# Patient Record
Sex: Male | Born: 1985 | Race: Black or African American | Hispanic: No | Marital: Single | Smoking: Current some day smoker
Health system: Southern US, Community
[De-identification: ages and names within clinical notes are randomized; demographics above are authoritative.]

## PROBLEM LIST (undated history)

## (undated) ENCOUNTER — Emergency Department (HOSPITAL_COMMUNITY): Admission: EM | Payer: Self-pay | Source: Home / Self Care

## (undated) DIAGNOSIS — F329 Major depressive disorder, single episode, unspecified: Secondary | ICD-10-CM

## (undated) DIAGNOSIS — F32A Depression, unspecified: Secondary | ICD-10-CM

## (undated) DIAGNOSIS — Y249XXD Unspecified firearm discharge, undetermined intent, subsequent encounter: Secondary | ICD-10-CM

## (undated) DIAGNOSIS — F319 Bipolar disorder, unspecified: Secondary | ICD-10-CM

## (undated) DIAGNOSIS — W3400XD Accidental discharge from unspecified firearms or gun, subsequent encounter: Secondary | ICD-10-CM

## (undated) DIAGNOSIS — I10 Essential (primary) hypertension: Secondary | ICD-10-CM

## (undated) DIAGNOSIS — F209 Schizophrenia, unspecified: Secondary | ICD-10-CM

## (undated) HISTORY — DX: Accidental discharge from unspecified firearms or gun, subsequent encounter: W34.00XD

## (undated) HISTORY — DX: Unspecified firearm discharge, undetermined intent, subsequent encounter: Y24.9XXD

## (undated) HISTORY — PX: WISDOM TOOTH EXTRACTION: SHX21

---
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
## 2007-06-24 ENCOUNTER — Emergency Department (HOSPITAL_COMMUNITY): Admission: EM | Admit: 2007-06-24 | Discharge: 2007-06-24 | Payer: Self-pay | Admitting: Emergency Medicine

## 2009-01-18 ENCOUNTER — Emergency Department (HOSPITAL_COMMUNITY): Admission: EM | Admit: 2009-01-18 | Discharge: 2009-01-18 | Payer: Self-pay | Admitting: Emergency Medicine

## 2010-05-02 ENCOUNTER — Emergency Department (HOSPITAL_COMMUNITY): Admission: EM | Admit: 2010-05-02 | Discharge: 2010-05-02 | Payer: Self-pay | Admitting: Emergency Medicine

## 2011-04-01 LAB — DIFFERENTIAL
Basophils Relative: 0 % (ref 0–1)
Lymphocytes Relative: 17 % (ref 12–46)
Lymphs Abs: 1.3 10*3/uL (ref 0.7–4.0)
Monocytes Absolute: 0.5 10*3/uL (ref 0.1–1.0)
Monocytes Relative: 6 % (ref 3–12)
Neutro Abs: 5.8 10*3/uL (ref 1.7–7.7)
Neutrophils Relative %: 75 % (ref 43–77)

## 2011-04-01 LAB — COMPREHENSIVE METABOLIC PANEL
Albumin: 4.3 g/dL (ref 3.5–5.2)
Alkaline Phosphatase: 79 U/L (ref 39–117)
BUN: 11 mg/dL (ref 6–23)
Calcium: 9.4 mg/dL (ref 8.4–10.5)
Creatinine, Ser: 0.89 mg/dL (ref 0.4–1.5)
Glucose, Bld: 98 mg/dL (ref 70–99)
Potassium: 4.3 mEq/L (ref 3.5–5.1)
Total Protein: 7.5 g/dL (ref 6.0–8.3)

## 2011-04-01 LAB — CBC
HCT: 47.1 % (ref 39.0–52.0)
Hemoglobin: 15.9 g/dL (ref 13.0–17.0)
MCHC: 33.8 g/dL (ref 30.0–36.0)
Platelets: 199 10*3/uL (ref 150–400)
RDW: 13.9 % (ref 11.5–15.5)

## 2012-10-17 ENCOUNTER — Emergency Department (HOSPITAL_COMMUNITY): Payer: Self-pay

## 2012-10-17 ENCOUNTER — Encounter (HOSPITAL_COMMUNITY): Payer: Self-pay | Admitting: *Deleted

## 2012-10-17 ENCOUNTER — Emergency Department (HOSPITAL_COMMUNITY)
Admission: EM | Admit: 2012-10-17 | Discharge: 2012-10-17 | Disposition: A | Discharge status: Home / Self Care | Payer: Self-pay | Attending: Emergency Medicine | Admitting: Emergency Medicine

## 2012-10-17 DIAGNOSIS — Y9301 Activity, walking, marching and hiking: Secondary | ICD-10-CM | POA: Insufficient documentation

## 2012-10-17 DIAGNOSIS — S82839A Other fracture of upper and lower end of unspecified fibula, initial encounter for closed fracture: Secondary | ICD-10-CM

## 2012-10-17 DIAGNOSIS — Z8659 Personal history of other mental and behavioral disorders: Secondary | ICD-10-CM | POA: Insufficient documentation

## 2012-10-17 DIAGNOSIS — Y929 Unspecified place or not applicable: Secondary | ICD-10-CM | POA: Insufficient documentation

## 2012-10-17 DIAGNOSIS — W010XXA Fall on same level from slipping, tripping and stumbling without subsequent striking against object, initial encounter: Secondary | ICD-10-CM | POA: Insufficient documentation

## 2012-10-17 DIAGNOSIS — F17200 Nicotine dependence, unspecified, uncomplicated: Secondary | ICD-10-CM | POA: Insufficient documentation

## 2012-10-17 DIAGNOSIS — S82899A Other fracture of unspecified lower leg, initial encounter for closed fracture: Secondary | ICD-10-CM | POA: Insufficient documentation

## 2012-10-17 HISTORY — DX: Depression, unspecified: F32.A

## 2012-10-17 HISTORY — DX: Major depressive disorder, single episode, unspecified: F32.9

## 2012-10-17 HISTORY — DX: Bipolar disorder, unspecified: F31.9

## 2012-10-17 MED ORDER — OXYCODONE-ACETAMINOPHEN 5-325 MG PO TABS: 1.0000 | ORAL_TABLET | Freq: Four times a day (QID) | ORAL | Status: DC | PRN

## 2012-10-17 MED ORDER — OXYCODONE-ACETAMINOPHEN 5-325 MG PO TABS
2.0000 | ORAL_TABLET | Freq: Once | ORAL | Status: AC
  Administered 2012-10-17: 2 via ORAL
  Filled 2012-10-17: qty 2
  Filled 2012-10-17: qty 1

## 2012-10-17 NOTE — Progress Notes (Signed)
Orthopedic Tech Progress Note Patient Details:  Joseph Bautista July 28, 1986 [id number]  Patient ID: [name], male   DOB: 25-Jun-1986, 26 y.o.   MRN: [id number]   [name] 10/17/2012, 10:17 AM Pt has crutches

## 2012-10-17 NOTE — ED Provider Notes (Signed)
Medical screening examination/treatment/procedure(s) were performed by non-physician practitioner and as supervising physician I was immediately available for consultation/collaboration.   Richardean Canal, MD 10/17/12 1007

## 2012-10-17 NOTE — ED Notes (Addendum)
Pt tripped down stairs on Friday. Pt states that he was taking tyenol for the pain, but no relief. Pt states swelling and increased since this mornng. Pt states painful to walk and woke him up out of sleep. Pt CNS intact, pulses present and cap refill less than 3

## 2012-10-17 NOTE — ED Provider Notes (Signed)
History     CSN: [id number]  Arrival date & time 10/17/12  0700   First MD Initiated Contact with Patient 10/17/12 [phone]      Chief Complaint  Patient presents with  . Ankle Pain    (Consider location/radiation/quality/duration/timing/severity/associated sxs/prior treatment) HPI Comments: Patient reports that two days ago he tripped walking down the stairs.  He twisted his left ankle when he fell.  Yesterday he reports that the pain became worse.  He has been using his mother's crutches to ambulate.  He has been unable to bear weight.  Pain and swelling located over the left lateral malleolus.  No previous injury of the ankle.  He denies pain in his knees or hips.  No pain of the right ankle.  He has taken Tylenol for the pain, but does not feel that it is helping.    Patient is a 26 y.o. male presenting with ankle pain. The history is provided by the patient.  Ankle Pain  Pertinent negatives include no numbness.    Past Medical History  Diagnosis Date  . Depression   . Bipolar 1 disorder     History reviewed. No pertinent past surgical history.  History reviewed. No pertinent family history.  History  Substance Use Topics  . Smoking status: Current Every Day Smoker  . Smokeless tobacco: Not on file  . Alcohol Use: Yes      Review of Systems  Musculoskeletal:       Left ankle pain and swelling  Neurological: Negative for numbness.  All other systems reviewed and are negative.    Allergies  Tomato  Home Medications   Current Outpatient Rx  Name  Route  Sig  Dispense  Refill  . ACETAMINOPHEN 500 MG PO TABS   Oral   Take 500 mg by mouth every 6 (six) hours as needed. For pain           BP 140/69  Pulse 80  Temp 98 F (36.7 C) (Oral)  Resp 16  SpO2 98%  Physical Exam  Nursing note and vitals reviewed. Constitutional: He appears well-developed and well-nourished. No distress.  HENT:  Head: Normocephalic and atraumatic.  Mouth/Throat: Oropharynx is  clear and moist.  Cardiovascular: Normal rate, regular rhythm and normal heart sounds.   Pulses:      Dorsalis pedis pulses are 2+ on the right side, and 2+ on the left side.  Pulmonary/Chest: Effort normal and breath sounds normal.  Musculoskeletal:       Right hip: He exhibits normal range of motion and no tenderness.       Left hip: He exhibits normal range of motion, no tenderness and no bony tenderness.       Right knee: He exhibits normal range of motion. no tenderness found.       Left knee: He exhibits normal range of motion, no swelling, no effusion and no erythema. no tenderness found.       Right ankle: He exhibits normal range of motion. no tenderness.       Left ankle: He exhibits swelling. He exhibits normal range of motion, no deformity, no laceration and normal pulse. tenderness. Lateral malleolus tenderness found.  Neurological: He is alert. No sensory deficit.  Skin: Skin is warm and dry. He is not diaphoretic.  Psychiatric: He has a normal mood and affect.    ED Course  Procedures (including critical care time)  Labs Reviewed - No data to display Dg Tibia/fibula Left  10/17/2012  *RADIOLOGY  REPORT*  Clinical Data: History of trauma from a fall.  Ankle pain.  LEFT TIBIA AND FIBULA - 2 VIEW  Comparison: Left ankle radiograph on 10/17/2012.  Findings: AP lateral views of the left tibia and fibula again demonstrate an obliquely oriented nondisplaced, nonangulated fracture through the lateral malleolus.  The remainder of the fibula is otherwise intact.  Tibia is intact.  Soft tissue swelling lateral to the lateral malleolus.  IMPRESSION: 1.  The obliquely oriented fracture of the lateral malleolus redemonstrated.  No other acute fractures.   Original Report Authenticated By: Trudie Reed, M.D.    Dg Ankle Complete Left  10/17/2012  *RADIOLOGY REPORT*  Clinical Data: Ankle pain.  LEFT ANKLE COMPLETE - 3+ VIEW  Comparison: No priors.  Findings: Three views of the left ankle  demonstrate an oblique nondisplaced nonangulated fracture through the lateral malleolus with overlying soft tissue swelling.  Distal tibia is intact.  The ankle mortise appears intact.  IMPRESSION: 1.  Oblique nondisplaced nonangulated fracture through the lateral malleolus.   Original Report Authenticated By: Trudie Reed, M.D.    Dg Foot Complete Left  10/17/2012  *RADIOLOGY REPORT*  Clinical Data: Ankle pain.  LEFT FOOT - COMPLETE 3+ VIEW  Comparison: No priors.  Findings: Three views of the left foot demonstrate no acute fracture, subluxation or dislocation in the bones of the foot. There is an oblique nondisplaced fracture of the distal fibula.  IMPRESSION: 1.  No acute findings in the left foot. 2.  Oblique nondisplaced fracture of the distal fibula in the region of the lateral malleolus.   Original Report Authenticated By: Trudie Reed, M.D.      No diagnosis found.    MDM  Patient with an xray showing oblique nondisplaced fracture of the distal fibula.  Fracture is closed.  Patient is neurovascularly intact.  Patient given Crutches and Posterior Splint.  Patient also given prescription for Percocet and Orthopedic follow up.        Pascal Lux [place], PA-C 10/17/12 1002

## 2012-10-17 NOTE — Progress Notes (Signed)
Orthopedic Tech Progress Note Patient Details:  PALMER CARLS 07/31/1986 [id number]  Ortho Devices Type of Ortho Device: Post splint Splint Material: Fiberglass Ortho Device/Splint Interventions: Application   Cammer, Mickie Bail 10/17/2012, 10:16 AM

## 2013-01-25 ENCOUNTER — Emergency Department (HOSPITAL_COMMUNITY)
Admission: EM | Admit: 2013-01-25 | Discharge: 2013-01-25 | Disposition: A | Discharge status: Home / Self Care | Payer: Self-pay | Attending: Emergency Medicine | Admitting: Emergency Medicine

## 2013-01-25 ENCOUNTER — Encounter (HOSPITAL_COMMUNITY): Payer: Self-pay | Admitting: Emergency Medicine

## 2013-01-25 ENCOUNTER — Emergency Department (HOSPITAL_COMMUNITY): Payer: Self-pay

## 2013-01-25 DIAGNOSIS — Y939 Activity, unspecified: Secondary | ICD-10-CM | POA: Insufficient documentation

## 2013-01-25 DIAGNOSIS — S82892G Other fracture of left lower leg, subsequent encounter for closed fracture with delayed healing: Secondary | ICD-10-CM

## 2013-01-25 DIAGNOSIS — G8911 Acute pain due to trauma: Secondary | ICD-10-CM | POA: Insufficient documentation

## 2013-01-25 DIAGNOSIS — Z8659 Personal history of other mental and behavioral disorders: Secondary | ICD-10-CM | POA: Insufficient documentation

## 2013-01-25 DIAGNOSIS — Y929 Unspecified place or not applicable: Secondary | ICD-10-CM | POA: Insufficient documentation

## 2013-01-25 DIAGNOSIS — F17200 Nicotine dependence, unspecified, uncomplicated: Secondary | ICD-10-CM | POA: Insufficient documentation

## 2013-01-25 DIAGNOSIS — Y33XXXA Other specified events, undetermined intent, initial encounter: Secondary | ICD-10-CM | POA: Insufficient documentation

## 2013-01-25 DIAGNOSIS — I10 Essential (primary) hypertension: Secondary | ICD-10-CM | POA: Insufficient documentation

## 2013-01-25 DIAGNOSIS — S8290XS Unspecified fracture of unspecified lower leg, sequela: Secondary | ICD-10-CM | POA: Insufficient documentation

## 2013-01-25 DIAGNOSIS — S82899A Other fracture of unspecified lower leg, initial encounter for closed fracture: Secondary | ICD-10-CM | POA: Insufficient documentation

## 2013-01-25 DIAGNOSIS — Z8781 Personal history of (healed) traumatic fracture: Secondary | ICD-10-CM | POA: Insufficient documentation

## 2013-01-25 HISTORY — DX: Essential (primary) hypertension: I10

## 2013-01-25 MED ORDER — NAPROXEN 500 MG PO TABS: 500.0000 mg | ORAL_TABLET | Freq: Two times a day (BID) | ORAL | Status: DC
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
## 2013-01-25 NOTE — ED Notes (Signed)
Pt reports 2 week hx of pain in l/ankle. Swelling noted x 1 day. 11/13 Fracture of l/ankle- seen once by ortho

## 2013-01-27 NOTE — ED Provider Notes (Signed)
Medical screening examination/treatment/procedure(s) were performed by non-physician practitioner and as supervising physician I was immediately available for consultation/collaboration.  Azana Kiesler T Adiba Fargnoli, MD 01/27/13 0753 

## 2013-05-28 ENCOUNTER — Emergency Department (HOSPITAL_COMMUNITY)
Admission: EM | Admit: 2013-05-28 | Discharge: 2013-05-28 | Disposition: A | Discharge status: Home / Self Care | Payer: Self-pay | Attending: Emergency Medicine | Admitting: Emergency Medicine

## 2013-05-28 ENCOUNTER — Encounter (HOSPITAL_COMMUNITY): Payer: Self-pay | Admitting: Emergency Medicine

## 2013-05-28 DIAGNOSIS — I10 Essential (primary) hypertension: Secondary | ICD-10-CM | POA: Insufficient documentation

## 2013-05-28 DIAGNOSIS — S0181XA Laceration without foreign body of other part of head, initial encounter: Secondary | ICD-10-CM

## 2013-05-28 DIAGNOSIS — Z8659 Personal history of other mental and behavioral disorders: Secondary | ICD-10-CM | POA: Insufficient documentation

## 2013-05-28 DIAGNOSIS — F17200 Nicotine dependence, unspecified, uncomplicated: Secondary | ICD-10-CM | POA: Insufficient documentation

## 2013-05-28 DIAGNOSIS — S01501A Unspecified open wound of lip, initial encounter: Secondary | ICD-10-CM | POA: Insufficient documentation

## 2013-05-28 DIAGNOSIS — Z791 Long term (current) use of non-steroidal anti-inflammatories (NSAID): Secondary | ICD-10-CM | POA: Insufficient documentation
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
## 2013-10-19 ENCOUNTER — Emergency Department (HOSPITAL_COMMUNITY): Payer: Self-pay

## 2013-10-19 ENCOUNTER — Emergency Department (HOSPITAL_COMMUNITY)
Admission: EM | Admit: 2013-10-19 | Discharge: 2013-10-19 | Disposition: A | Discharge status: Home / Self Care | Payer: Self-pay | Attending: Emergency Medicine | Admitting: Emergency Medicine

## 2013-10-19 DIAGNOSIS — F17200 Nicotine dependence, unspecified, uncomplicated: Secondary | ICD-10-CM | POA: Insufficient documentation

## 2013-10-19 DIAGNOSIS — IMO0002 Reserved for concepts with insufficient information to code with codable children: Secondary | ICD-10-CM

## 2013-10-19 DIAGNOSIS — T07XXXA Unspecified multiple injuries, initial encounter: Secondary | ICD-10-CM

## 2013-10-19 DIAGNOSIS — Z8659 Personal history of other mental and behavioral disorders: Secondary | ICD-10-CM | POA: Insufficient documentation

## 2013-10-19 DIAGNOSIS — Z79899 Other long term (current) drug therapy: Secondary | ICD-10-CM | POA: Insufficient documentation

## 2013-10-19 DIAGNOSIS — S60219A Contusion of unspecified wrist, initial encounter: Secondary | ICD-10-CM | POA: Insufficient documentation

## 2013-10-19 DIAGNOSIS — S7010XA Contusion of unspecified thigh, initial encounter: Secondary | ICD-10-CM | POA: Insufficient documentation

## 2013-10-19 DIAGNOSIS — I10 Essential (primary) hypertension: Secondary | ICD-10-CM | POA: Insufficient documentation

## 2013-10-19 DIAGNOSIS — S8000XA Contusion of unspecified knee, initial encounter: Secondary | ICD-10-CM | POA: Insufficient documentation

## 2013-10-19 MED ORDER — OXYCODONE-ACETAMINOPHEN 5-325 MG PO TABS: ORAL_TABLET | ORAL | Status: DC

## 2013-10-19 MED ORDER — ONDANSETRON 4 MG PO TBDP
4.0000 mg | ORAL_TABLET | Freq: Once | ORAL | Status: AC
  Administered 2013-10-19: 4 mg via ORAL
  Filled 2013-10-19: qty 1

## 2013-10-19 MED ORDER — MORPHINE SULFATE 4 MG/ML IJ SOLN
6.0000 mg | Freq: Once | INTRAMUSCULAR | Status: AC
  Administered 2013-10-19: 6 mg via INTRAMUSCULAR
  Filled 2013-10-19: qty 2
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
## 2013-10-19 NOTE — ED Notes (Signed)
Ortho tech called for application of wrist splint and sling.

## 2013-10-23 ENCOUNTER — Emergency Department (HOSPITAL_COMMUNITY)
Admission: EM | Admit: 2013-10-23 | Discharge: 2013-10-23 | Disposition: A | Discharge status: Home / Self Care | Payer: Self-pay | Attending: Emergency Medicine | Admitting: Emergency Medicine

## 2013-10-23 ENCOUNTER — Encounter (HOSPITAL_COMMUNITY): Payer: Self-pay | Admitting: Emergency Medicine

## 2013-10-23 DIAGNOSIS — G8911 Acute pain due to trauma: Secondary | ICD-10-CM | POA: Insufficient documentation

## 2013-10-23 DIAGNOSIS — M25539 Pain in unspecified wrist: Secondary | ICD-10-CM | POA: Insufficient documentation

## 2013-10-23 DIAGNOSIS — T07XXXA Unspecified multiple injuries, initial encounter: Secondary | ICD-10-CM

## 2013-10-23 DIAGNOSIS — F17200 Nicotine dependence, unspecified, uncomplicated: Secondary | ICD-10-CM | POA: Insufficient documentation

## 2013-10-23 DIAGNOSIS — M79609 Pain in unspecified limb: Secondary | ICD-10-CM | POA: Insufficient documentation

## 2013-10-23 DIAGNOSIS — Z8659 Personal history of other mental and behavioral disorders: Secondary | ICD-10-CM | POA: Insufficient documentation

## 2013-10-23 DIAGNOSIS — I10 Essential (primary) hypertension: Secondary | ICD-10-CM | POA: Insufficient documentation

## 2013-10-23 MED ORDER — METHOCARBAMOL 750 MG PO TABS: 750.0000 mg | ORAL_TABLET | Freq: Four times a day (QID) | ORAL | Status: DC

## 2013-10-23 MED ORDER — OXYCODONE-ACETAMINOPHEN 5-325 MG PO TABS: 2.0000 | ORAL_TABLET | ORAL | Status: DC | PRN

## 2013-10-23 MED ORDER — IBUPROFEN 600 MG PO TABS: 600.0000 mg | ORAL_TABLET | Freq: Four times a day (QID) | ORAL | Status: DC | PRN
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
## 2013-10-24 NOTE — ED Provider Notes (Signed)
Medical screening examination/treatment/procedure(s) were performed by non-physician practitioner and as supervising physician I was immediately available for consultation/collaboration.  EKG Interpretation   None        Derwood Kaplan, MD 10/24/13 2305

## 2016-01-28 ENCOUNTER — Emergency Department (HOSPITAL_COMMUNITY)
Admission: EM | Admit: 2016-01-28 | Discharge: 2016-01-28 | Disposition: A | Discharge status: Home / Self Care | Payer: Self-pay | Attending: Emergency Medicine | Admitting: Emergency Medicine

## 2016-01-28 DIAGNOSIS — S0181XA Laceration without foreign body of other part of head, initial encounter: Secondary | ICD-10-CM | POA: Insufficient documentation

## 2016-01-28 DIAGNOSIS — S61012A Laceration without foreign body of left thumb without damage to nail, initial encounter: Secondary | ICD-10-CM | POA: Insufficient documentation

## 2016-01-28 DIAGNOSIS — Y9389 Activity, other specified: Secondary | ICD-10-CM | POA: Insufficient documentation

## 2016-01-28 DIAGNOSIS — IMO0002 Reserved for concepts with insufficient information to code with codable children: Secondary | ICD-10-CM

## 2016-01-28 DIAGNOSIS — Y998 Other external cause status: Secondary | ICD-10-CM | POA: Insufficient documentation

## 2016-01-28 DIAGNOSIS — I10 Essential (primary) hypertension: Secondary | ICD-10-CM | POA: Insufficient documentation

## 2016-01-28 DIAGNOSIS — Y9289 Other specified places as the place of occurrence of the external cause: Secondary | ICD-10-CM | POA: Insufficient documentation

## 2016-01-28 DIAGNOSIS — F419 Anxiety disorder, unspecified: Secondary | ICD-10-CM | POA: Insufficient documentation

## 2016-01-28 DIAGNOSIS — Z23 Encounter for immunization: Secondary | ICD-10-CM | POA: Insufficient documentation

## 2016-01-28 DIAGNOSIS — W260XXA Contact with knife, initial encounter: Secondary | ICD-10-CM | POA: Insufficient documentation

## 2016-01-28 DIAGNOSIS — Z87891 Personal history of nicotine dependence: Secondary | ICD-10-CM | POA: Insufficient documentation

## 2016-01-28 MED ORDER — TETANUS-DIPHTH-ACELL PERTUSSIS 5-2.5-18.5 LF-MCG/0.5 IM SUSP
0.5000 mL | Freq: Once | INTRAMUSCULAR | Status: AC
  Administered 2016-01-28: 0.5 mL via INTRAMUSCULAR
  Filled 2016-01-28: qty 0.5

## 2016-01-28 MED ORDER — LIDOCAINE HCL 2 % IJ SOLN
10.0000 mL | Freq: Once | INTRAMUSCULAR | Status: AC
  Administered 2016-01-28: 200 mg via INTRADERMAL
  Filled 2016-01-28: qty 20

## 2016-01-28 NOTE — Discharge Instructions (Signed)
Follow-up with your doctor in 3 days for a wound recheck. Continue taking Motrin and Tylenol for your discomfort. Return to ED for any new or worsening symptoms as we discussed.
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
## 2016-01-28 NOTE — ED Notes (Signed)
Declined W/C at D/C and was escorted to lobby by RN. 
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
## 2016-02-04 ENCOUNTER — Emergency Department (HOSPITAL_COMMUNITY)
Admission: EM | Admit: 2016-02-04 | Discharge: 2016-02-04 | Disposition: A | Discharge status: Home / Self Care | Payer: Self-pay | Attending: Emergency Medicine | Admitting: Emergency Medicine

## 2016-02-04 ENCOUNTER — Encounter (HOSPITAL_COMMUNITY): Payer: Self-pay

## 2016-02-04 DIAGNOSIS — I10 Essential (primary) hypertension: Secondary | ICD-10-CM | POA: Insufficient documentation

## 2016-02-04 DIAGNOSIS — Z87828 Personal history of other (healed) physical injury and trauma: Secondary | ICD-10-CM | POA: Insufficient documentation

## 2016-02-04 DIAGNOSIS — F17200 Nicotine dependence, unspecified, uncomplicated: Secondary | ICD-10-CM | POA: Insufficient documentation

## 2016-02-04 DIAGNOSIS — Z79899 Other long term (current) drug therapy: Secondary | ICD-10-CM | POA: Insufficient documentation

## 2016-02-04 DIAGNOSIS — Z79891 Long term (current) use of opiate analgesic: Secondary | ICD-10-CM | POA: Insufficient documentation

## 2016-02-04 DIAGNOSIS — Z8659 Personal history of other mental and behavioral disorders: Secondary | ICD-10-CM | POA: Insufficient documentation

## 2016-02-04 DIAGNOSIS — Z4802 Encounter for removal of sutures: Secondary | ICD-10-CM | POA: Insufficient documentation

## 2016-02-04 NOTE — ED Notes (Signed)
Declined W/C at D/C and was escorted to lobby by RN. 
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
## 2016-02-04 NOTE — Discharge Instructions (Signed)

## 2016-02-04 NOTE — ED Notes (Signed)
Here for suture removal from left thumb.  

## 2016-08-26 ENCOUNTER — Emergency Department (HOSPITAL_BASED_OUTPATIENT_CLINIC_OR_DEPARTMENT_OTHER): Payer: No Typology Code available for payment source

## 2016-08-26 ENCOUNTER — Encounter (HOSPITAL_BASED_OUTPATIENT_CLINIC_OR_DEPARTMENT_OTHER): Payer: Self-pay | Admitting: Emergency Medicine

## 2016-08-26 ENCOUNTER — Emergency Department (HOSPITAL_BASED_OUTPATIENT_CLINIC_OR_DEPARTMENT_OTHER)
Admission: EM | Admit: 2016-08-26 | Discharge: 2016-08-27 | Disposition: A | Discharge status: Home / Self Care | Payer: No Typology Code available for payment source | Attending: Emergency Medicine | Admitting: Emergency Medicine

## 2016-08-26 DIAGNOSIS — Y999 Unspecified external cause status: Secondary | ICD-10-CM | POA: Diagnosis not present

## 2016-08-26 DIAGNOSIS — T148XXA Other injury of unspecified body region, initial encounter: Secondary | ICD-10-CM

## 2016-08-26 DIAGNOSIS — I10 Essential (primary) hypertension: Secondary | ICD-10-CM | POA: Insufficient documentation

## 2016-08-26 DIAGNOSIS — Y92410 Unspecified street and highway as the place of occurrence of the external cause: Secondary | ICD-10-CM | POA: Insufficient documentation

## 2016-08-26 DIAGNOSIS — Y939 Activity, unspecified: Secondary | ICD-10-CM | POA: Diagnosis not present

## 2016-08-26 DIAGNOSIS — F17200 Nicotine dependence, unspecified, uncomplicated: Secondary | ICD-10-CM | POA: Diagnosis not present

## 2016-08-26 DIAGNOSIS — S3992XA Unspecified injury of lower back, initial encounter: Secondary | ICD-10-CM | POA: Diagnosis present

## 2016-08-26 DIAGNOSIS — S39012A Strain of muscle, fascia and tendon of lower back, initial encounter: Secondary | ICD-10-CM | POA: Diagnosis not present

## 2016-08-26 MED ORDER — CYCLOBENZAPRINE HCL 5 MG PO TABS: 5.0000 mg | ORAL_TABLET | Freq: Two times a day (BID) | ORAL | 0 refills | Status: AC | PRN

## 2016-08-26 MED ORDER — HYDROCODONE-ACETAMINOPHEN 7.5-325 MG/15ML PO SOLN
10.0000 mL | Freq: Once | ORAL | Status: AC
  Administered 2016-08-26: 10 mL via ORAL
  Filled 2016-08-26: qty 15

## 2016-08-26 MED ORDER — HYDROCODONE-ACETAMINOPHEN 5-325 MG PO TABS: 1.0000 | ORAL_TABLET | Freq: Three times a day (TID) | ORAL | 0 refills | Status: AC | PRN

## 2016-08-26 MED ORDER — CYCLOBENZAPRINE HCL 5 MG PO TABS
5.0000 mg | ORAL_TABLET | Freq: Once | ORAL | Status: AC
  Administered 2016-08-26: 5 mg via ORAL
  Filled 2016-08-26: qty 1

## 2016-08-26 MED ORDER — ACETAMINOPHEN 500 MG PO TABS: 1000.0000 mg | ORAL_TABLET | Freq: Three times a day (TID) | ORAL | 0 refills | Status: AC
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
## 2016-08-26 NOTE — ED Notes (Signed)
Patient transported to X-ray 

## 2016-08-26 NOTE — ED Notes (Signed)
MD at bedside. 

## 2016-08-27 NOTE — ED Notes (Signed)
Pt verbalizes understanding of d/c instructions and denies any further needs at this time. 

## 2016-09-05 ENCOUNTER — Encounter (HOSPITAL_COMMUNITY): Payer: Self-pay | Admitting: Emergency Medicine

## 2016-09-05 ENCOUNTER — Emergency Department (HOSPITAL_COMMUNITY): Payer: No Typology Code available for payment source

## 2016-09-05 ENCOUNTER — Emergency Department (HOSPITAL_COMMUNITY)
Admission: EM | Admit: 2016-09-05 | Discharge: 2016-09-05 | Disposition: A | Discharge status: Home / Self Care | Payer: No Typology Code available for payment source | Attending: Emergency Medicine | Admitting: Emergency Medicine

## 2016-09-05 DIAGNOSIS — I10 Essential (primary) hypertension: Secondary | ICD-10-CM | POA: Diagnosis not present

## 2016-09-05 DIAGNOSIS — M545 Low back pain, unspecified: Secondary | ICD-10-CM

## 2016-09-05 DIAGNOSIS — F17200 Nicotine dependence, unspecified, uncomplicated: Secondary | ICD-10-CM | POA: Insufficient documentation

## 2016-09-05 DIAGNOSIS — Y999 Unspecified external cause status: Secondary | ICD-10-CM | POA: Diagnosis not present

## 2016-09-05 DIAGNOSIS — Y939 Activity, unspecified: Secondary | ICD-10-CM | POA: Diagnosis not present

## 2016-09-05 DIAGNOSIS — Y92410 Unspecified street and highway as the place of occurrence of the external cause: Secondary | ICD-10-CM | POA: Insufficient documentation

## 2016-09-05 MED ORDER — KETOROLAC TROMETHAMINE 60 MG/2ML IM SOLN
60.0000 mg | Freq: Once | INTRAMUSCULAR | Status: AC
  Administered 2016-09-05: 60 mg via INTRAMUSCULAR
  Filled 2016-09-05: qty 2

## 2016-09-05 MED ORDER — TRAMADOL HCL 50 MG PO TABS: 50.0000 mg | ORAL_TABLET | Freq: Four times a day (QID) | ORAL | 0 refills | Status: DC | PRN

## 2016-09-05 MED ORDER — PREDNISONE 20 MG PO TABS
60.0000 mg | ORAL_TABLET | Freq: Once | ORAL | Status: AC
  Administered 2016-09-05: 60 mg via ORAL
  Filled 2016-09-05: qty 3

## 2016-09-05 MED ORDER — PREDNISONE 10 MG PO TABS: 20.0000 mg | ORAL_TABLET | Freq: Every day | ORAL | 0 refills | Status: DC
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
## 2016-10-07 ENCOUNTER — Ambulatory Visit (INDEPENDENT_AMBULATORY_CARE_PROVIDER_SITE_OTHER): Payer: Self-pay | Admitting: Orthopaedic Surgery

## 2017-03-11 ENCOUNTER — Encounter (HOSPITAL_COMMUNITY): Payer: Self-pay | Admitting: Emergency Medicine

## 2017-03-11 ENCOUNTER — Ambulatory Visit (HOSPITAL_COMMUNITY)
Admission: EM | Admit: 2017-03-11 | Discharge: 2017-03-11 | Disposition: A | Discharge status: Home / Self Care | Payer: Self-pay | Attending: Family Medicine | Admitting: Family Medicine

## 2017-03-11 DIAGNOSIS — H6500 Acute serous otitis media, unspecified ear: Secondary | ICD-10-CM

## 2017-03-11 MED ORDER — AMOXICILLIN 875 MG PO TABS: 875.0000 mg | ORAL_TABLET | Freq: Two times a day (BID) | ORAL | 0 refills | Status: DC

## 2017-03-11 MED ORDER — IPRATROPIUM BROMIDE 0.06 % NA SOLN: 2.0000 | Freq: Four times a day (QID) | NASAL | 0 refills | Status: DC

## 2017-03-11 NOTE — ED Provider Notes (Signed)
CSN: [id number]     Arrival date & time 03/11/17  1004 History   First MD Initiated Contact with Patient 03/11/17 1040     Chief Complaint  Patient presents with  . Otalgia   (Consider location/radiation/quality/duration/timing/severity/associated sxs/prior Treatment) Patient c/o left ear pain and uri sx's for a week   The history is provided by the patient.  Otalgia  Location:  Left Behind ear:  No abnormality Quality:  Aching Severity:  Moderate Onset quality:  Sudden Duration:  1 week Timing:  Intermittent Progression:  Worsening Chronicity:  New Relieved by:  Nothing Worsened by:  Nothing Ineffective treatments:  None tried Associated symptoms: hearing loss and rhinorrhea     Past Medical History:  Diagnosis Date  . Bipolar 1 disorder (HCC)   . Depression   . Hypertension    History reviewed. No pertinent surgical history. Family History  Problem Relation Age of Onset  . Diabetes Mother   . Hypertension Mother    Social History  Substance Use Topics  . Smoking status: Current Every Day Smoker  . Smokeless tobacco: Never Used  . Alcohol use Yes     Comment: occ    Review of Systems  Constitutional: Positive for fatigue.  HENT: Positive for ear pain, hearing loss and rhinorrhea.   Eyes: Negative.   Respiratory: Negative.   Cardiovascular: Negative.   Gastrointestinal: Negative.   Endocrine: Negative.   Genitourinary: Negative.   Musculoskeletal: Negative.   Allergic/Immunologic: Negative.   Neurological: Negative.   Hematological: Negative.   Psychiatric/Behavioral: Negative.     Allergies  Tomato  Home Medications   Prior to Admission medications   Medication Sig Start Date End Date Taking? Authorizing Provider  sertraline (ZOLOFT) 100 MG tablet Take 100 mg by mouth daily.   Yes Historical Provider, MD   Meds Ordered and Administered this Visit  Medications - No data to display  BP (!) 153/106 (BP Location: Left Arm)   Pulse 67   Temp  98.7 F (37.1 C) (Oral)   Resp 18   SpO2 99%  No data found.   Physical Exam  Constitutional: He is oriented to person, place, and time. He appears well-developed and well-nourished.  HENT:  Head: Normocephalic and atraumatic.  Right Ear: External ear normal.  Left Ear: External ear normal.  Mouth/Throat: Oropharynx is clear and moist.  Left TM erythematous and dull and decreased hearing acuity.  Eyes: Conjunctivae and EOM are normal. Pupils are equal, round, and reactive to light.  Neck: Normal range of motion. Neck supple.  Cardiovascular: Normal rate, regular rhythm and normal heart sounds.   Pulmonary/Chest: Effort normal and breath sounds normal.  Musculoskeletal: Normal range of motion.  Neurological: He is alert and oriented to person, place, and time.  Nursing note and vitals reviewed.   Urgent Care Course     Procedures (including critical care time)  Labs Review Labs Reviewed - No data to display  Imaging Review No results found.   Visual Acuity Review  Right Eye Distance:   Left Eye Distance:   Bilateral Distance:    Right Eye Near:   Left Eye Near:    Bilateral Near:         MDM   Otitis Media Left ear - Amoxicillin 875mg  one po bid x 7 days #14,  Atrovent Nasal Spray Push po fluids, rest, tylenol and motrin otc prn as directed for fever, arthralgias, and myalgias.  Follow up prn if sx's continue or persist.  Deatra CanterWilliam J Jair Lindblad, FNP 03/11/17 1109    Deatra CanterWilliam J Nelle Sayed, FNP 03/11/17 [phone]

## 2017-03-11 NOTE — ED Triage Notes (Signed)
[facility] a [facility] left sided ear pain x 1 week.

## 2017-07-16 ENCOUNTER — Emergency Department (HOSPITAL_BASED_OUTPATIENT_CLINIC_OR_DEPARTMENT_OTHER): Payer: Self-pay

## 2017-07-16 ENCOUNTER — Encounter (HOSPITAL_BASED_OUTPATIENT_CLINIC_OR_DEPARTMENT_OTHER): Payer: Self-pay

## 2017-07-16 ENCOUNTER — Emergency Department (HOSPITAL_BASED_OUTPATIENT_CLINIC_OR_DEPARTMENT_OTHER)
Admission: EM | Admit: 2017-07-16 | Discharge: 2017-07-16 | Disposition: A | Discharge status: Home / Self Care | Payer: Self-pay | Attending: Emergency Medicine | Admitting: Emergency Medicine

## 2017-07-16 DIAGNOSIS — R42 Dizziness and giddiness: Secondary | ICD-10-CM | POA: Insufficient documentation

## 2017-07-16 DIAGNOSIS — R7989 Other specified abnormal findings of blood chemistry: Secondary | ICD-10-CM | POA: Insufficient documentation

## 2017-07-16 DIAGNOSIS — F17210 Nicotine dependence, cigarettes, uncomplicated: Secondary | ICD-10-CM | POA: Insufficient documentation

## 2017-07-16 DIAGNOSIS — R945 Abnormal results of liver function studies: Secondary | ICD-10-CM

## 2017-07-16 DIAGNOSIS — I10 Essential (primary) hypertension: Secondary | ICD-10-CM | POA: Insufficient documentation

## 2017-07-16 LAB — COMPREHENSIVE METABOLIC PANEL
ALT: 89 U/L — AB (ref 17–63)
AST: 52 U/L — ABNORMAL HIGH (ref 15–41)
Albumin: 4.7 g/dL (ref 3.5–5.0)
Alkaline Phosphatase: 191 U/L — ABNORMAL HIGH (ref 38–126)
Anion gap: 9 (ref 5–15)
BILIRUBIN TOTAL: 0.8 mg/dL (ref 0.3–1.2)
BUN: 12 mg/dL (ref 6–20)
CALCIUM: 9.5 mg/dL (ref 8.9–10.3)
CHLORIDE: 101 mmol/L (ref 101–111)
CO2: 28 mmol/L (ref 22–32)
CREATININE: 1.07 mg/dL (ref 0.61–1.24)
GFR calc non Af Amer: >60 mL/min (ref >60)
Glucose, Bld: 110 mg/dL — ABNORMAL HIGH (ref 65–99)
Potassium: 3.9 mmol/L (ref 3.5–5.1)
Sodium: 138 mmol/L (ref 135–145)
TOTAL PROTEIN: 8.1 g/dL (ref 6.5–8.1)

## 2017-07-16 LAB — CBC
HEMATOCRIT: 47 % (ref 39.0–52.0)
Hemoglobin: 16.3 g/dL (ref 13.0–17.0)
MCH: 31.8 pg (ref 26.0–34.0)
MCHC: 34.7 g/dL (ref 30.0–36.0)
MCV: 91.8 fL (ref 78.0–100.0)
PLATELETS: 191 10*3/uL (ref 150–400)
RBC: 5.12 MIL/uL (ref 4.22–5.81)
RDW: 13.1 % (ref 11.5–15.5)
WBC: 7.1 10*3/uL (ref 4.0–10.5)

## 2017-07-16 LAB — CBG MONITORING, ED: Glucose-Capillary: 99 mg/dL (ref 65–99)

## 2017-07-16 NOTE — ED Notes (Signed)
Pt states he is in no pain now. When pain is present it radiates in right shoulder, lower back to front of abdomen. Pt stated that the pain moves all around and at times pain shoots to head and causes headache.

## 2017-07-16 NOTE — ED Notes (Signed)
Family at bedside. 

## 2017-07-16 NOTE — ED Triage Notes (Signed)
C/o dizziness, sweating, abd pain and right flank pain yesterday-NAD-steady gait

## 2017-07-16 NOTE — ED Notes (Signed)
Pt on monitor 

## 2017-07-16 NOTE — Discharge Instructions (Signed)
Is important that you stay well-hydrated with water. Follow-up with primary care for further evaluation of your symptoms, and to recheck your blood pressure. Return to the emergency room if you have persistent pain, any change in your symptoms, or any new or concerning symptoms.

## 2017-07-16 NOTE — ED Provider Notes (Signed)
[facility] Provider Note   CSN: [id number] Arrival date & time: 07/16/17  1150     History   Chief Complaint Chief Complaint  Patient presents with  . Dizziness    HPI Joseph Bautista is a 31 y.o. male with abdominal pain, right-sided chest pain, and sweating last night.  Patient states that he had acute onset of symptoms last night while at rest. He states the pain lasted for 10-15 minutes and then went away. Since then, he had a feeling of lightheadedness. He denies dizziness, vertigo, or feeling off-balance. He states he has never felt like this before. He denies pain currently or since last night, but states that he's had intermittent feelings of lightheadedness today. He has not had anything to eat or drink today, but states his appetite feels normal. He denies fevers, chills, sore throat, congestion, cough, chest pain, shortness of breath, nausea, vomiting, abdominal pain, numbness, or tingling. He denies urinary symptoms or abnormal bowel movements. He denies dizziness, vertigo, or feeling off balance. He denies other medical problems, and states he does not take medications on a daily basis. He reports increase in stress, as his good friend died last week, and the funeral was Tuesday. He reports increased alcohol consumption on Tuesday night, without intake.  HPI  Past Medical History:  Diagnosis Date  . Bipolar 1 disorder (HCC)   . Depression   . Hypertension     There are no active problems to display for this patient.   History reviewed. No pertinent surgical history.     Home Medications    Prior to Admission medications   Not on File    Family History Family History  Problem Relation Age of Onset  . Diabetes Mother   . Hypertension Mother     Social History Social History  Substance Use Topics  . Smoking status: Current Every Day Smoker    Types: Cigarettes  . Smokeless tobacco: Never Used  . Alcohol use Yes     Comment: weekly       Allergies   Tomato   Review of Systems Review of Systems  Constitutional: Negative for chills and fever.  HENT: Negative for congestion, sore throat and trouble swallowing.   Eyes: Negative for photophobia.  Respiratory: Negative for cough, chest tightness and shortness of breath.   Cardiovascular: Negative for chest pain.       Right-sided chest pain, resolved  Gastrointestinal: Negative for blood in stool, constipation, diarrhea, nausea and vomiting.       Abdominal pain, resolved  Genitourinary: Negative for dysuria, frequency and hematuria.  Musculoskeletal: Negative for neck pain and neck stiffness.  Skin: Negative for wound.  Allergic/Immunologic: Negative for immunocompromised state.  Neurological: Positive for light-headedness. Negative for dizziness.  Psychiatric/Behavioral: Negative for agitation and confusion.       Increased stress     Physical Exam Updated Vital Signs BP (!) 141/92   Pulse 62   Temp 97.9 F (36.6 C) (Oral)   Resp 12   Ht 6\' 2"  (1.88 m)   Wt 103.9 kg (229 lb)   SpO2 98%   BMI 29.40 kg/m   Physical Exam  Constitutional: He is oriented to person, place, and time. He appears well-developed and well-nourished.  HENT:  Head: Normocephalic and atraumatic.  Nose: Nose normal.  Mouth/Throat: Uvula is midline, oropharynx is clear and moist and mucous membranes are normal.  Eyes: Pupils are equal, round, and reactive to light. Conjunctivae and EOM are normal.  Neck: Normal range of motion. Neck supple.  Cardiovascular: Normal rate, regular rhythm, normal heart sounds and intact distal pulses.   Pulmonary/Chest: Effort normal and breath sounds normal. No respiratory distress. He has no wheezes.  Abdominal: Soft. Bowel sounds are normal. He exhibits no distension. There is no tenderness. There is no rebound and no guarding.  Musculoskeletal: Normal range of motion.  Lymphadenopathy:    He has no cervical adenopathy.  Neurological: He is  alert and oriented to person, place, and time. He has normal strength. No cranial nerve deficit or sensory deficit. He displays a negative Romberg sign. GCS eye subscore is 4. GCS verbal subscore is 5. GCS motor subscore is 6.  Skin: Skin is warm and dry.  Psychiatric: He has a normal mood and affect.  Nursing note and vitals reviewed.    ED Treatments / Results  Labs (all labs ordered are listed, but only abnormal results are displayed) Labs Reviewed  COMPREHENSIVE METABOLIC PANEL - Abnormal; Notable for the following:       Result Value   Glucose, Bld 110 (*)    AST 52 (*)    ALT 89 (*)    Alkaline Phosphatase 191 (*)    All other components within normal limits  CBC  CBG MONITORING, ED  CBG MONITORING, ED    EKG  EKG Interpretation  Date/Time:  Thursday July 16 2017 12:11:35 EDT Ventricular Rate:  55 PR Interval:    QRS Duration: 85 QT Interval:  390 QTC Calculation: 373 R Axis:   56 Text Interpretation:  Sinus rhythm No old tracing to compare Confirmed by Jerelyn ScottLinker, Martha [phone]([id number]) on 07/16/2017 12:37:45 PM       Radiology Dg Chest 2 View  Result Date: 07/16/2017 CLINICAL DATA:  Dizziness, pain inferior to the right arm and in the back last night. Persistent dizziness today. Current smoker. History of hypertension. EXAM: CHEST  2 VIEW COMPARISON:  Chest x-ray of January 18, 2009 FINDINGS: The lungs are well-expanded. The interstitial markings are coarse. There is no alveolar infiltrate or pleural effusion. The heart is top-normal in size. The pulmonary vascularity is normal. The trachea is midline. The bony thorax exhibits no acute abnormality. IMPRESSION: Mild chronic interstitial prominence likely reflecting the smoking history. There is no acute cardiopulmonary abnormality. Electronically Signed   By: David  SwazilandJordan M.D.   On: 07/16/2017 12:55    Procedures Procedures (including critical care time)  Medications Ordered in ED Medications - No data to  display   Initial Impression / Assessment and Plan / ED Course  I have reviewed the triage vital signs and the nursing notes.  Pertinent labs & imaging results that were available during my care of the patient were reviewed by me and considered in my medical decision making (see chart for details).     Patient with tenderness 15 minutes of symptoms last night, and was all spontaneously, and without current symptoms. Denies feeling of lightheadedness currently. Will order orthostatics, basic labs, EKG, and chest x-ray for further evaluation.  Orthostatics negative, EKG and chest x-ray negative. CMP showed mildly elevated liver enzymes, and CBC negative. As patient is currently without symptoms, physical exam was reassuring, and labs are reassuring, doubt cardiac or pulmonary cause for his symptoms. Doubt abdominal infection or pathologic process at this time. Discussed follow-up with PCP for evaluation of blood pressure and liver function. Per chart review, patient blood pressure has been elevated at every visit. Discussed case with attending, Dr. Karma GanjaLinker agrees to plan. Patient  appears safe for discharge at this time. Return precautions given. Patient states he understands and agrees to plan.   Final Clinical Impressions(s) / ED Diagnoses   Final diagnoses:  Light headedness  Elevated LFTs    New Prescriptions There are no discharge medications for this patient.    Alveria ApleyCaccavale, Alondria Mousseau, PA-C 07/16/17 1645    Phillis HaggisMabe, Martha L, MD 07/17/17 [phone]

## 2017-07-16 NOTE — ED Notes (Signed)
Patient transported to X-ray 

## 2017-10-12 ENCOUNTER — Encounter (HOSPITAL_BASED_OUTPATIENT_CLINIC_OR_DEPARTMENT_OTHER): Payer: Self-pay

## 2017-10-12 ENCOUNTER — Emergency Department (HOSPITAL_BASED_OUTPATIENT_CLINIC_OR_DEPARTMENT_OTHER)
Admission: EM | Admit: 2017-10-12 | Discharge: 2017-10-12 | Disposition: A | Discharge status: Home / Self Care | Payer: Self-pay | Attending: Emergency Medicine | Admitting: Emergency Medicine

## 2017-10-12 DIAGNOSIS — F319 Bipolar disorder, unspecified: Secondary | ICD-10-CM | POA: Insufficient documentation

## 2017-10-12 DIAGNOSIS — F17210 Nicotine dependence, cigarettes, uncomplicated: Secondary | ICD-10-CM | POA: Insufficient documentation

## 2017-10-12 DIAGNOSIS — I10 Essential (primary) hypertension: Secondary | ICD-10-CM | POA: Insufficient documentation

## 2017-10-12 DIAGNOSIS — M545 Low back pain: Secondary | ICD-10-CM | POA: Insufficient documentation

## 2017-10-12 DIAGNOSIS — F209 Schizophrenia, unspecified: Secondary | ICD-10-CM | POA: Insufficient documentation

## 2017-10-12 DIAGNOSIS — G8929 Other chronic pain: Secondary | ICD-10-CM | POA: Insufficient documentation

## 2017-10-12 DIAGNOSIS — Z79899 Other long term (current) drug therapy: Secondary | ICD-10-CM | POA: Insufficient documentation

## 2017-10-12 DIAGNOSIS — R52 Pain, unspecified: Secondary | ICD-10-CM

## 2017-10-12 DIAGNOSIS — R51 Headache: Secondary | ICD-10-CM | POA: Insufficient documentation

## 2017-10-12 DIAGNOSIS — M542 Cervicalgia: Secondary | ICD-10-CM | POA: Insufficient documentation

## 2017-10-12 DIAGNOSIS — R6889 Other general symptoms and signs: Secondary | ICD-10-CM

## 2017-10-12 HISTORY — DX: Schizophrenia, unspecified: F20.9

## 2017-10-12 LAB — CBG MONITORING, ED: Glucose-Capillary: 100 mg/dL — ABNORMAL HIGH (ref 65–99)

## 2017-10-12 MED ORDER — PROCHLORPERAZINE EDISYLATE 5 MG/ML IJ SOLN
10.0000 mg | Freq: Once | INTRAMUSCULAR | Status: AC
  Administered 2017-10-12: 10 mg via INTRAMUSCULAR
  Filled 2017-10-12: qty 2

## 2017-10-12 MED ORDER — DIPHENHYDRAMINE HCL 25 MG PO CAPS
25.0000 mg | ORAL_CAPSULE | Freq: Once | ORAL | Status: AC
  Administered 2017-10-12: 25 mg via ORAL
  Filled 2017-10-12: qty 1

## 2017-10-12 MED ORDER — KETOROLAC TROMETHAMINE 60 MG/2ML IM SOLN
60.0000 mg | Freq: Once | INTRAMUSCULAR | Status: AC
  Administered 2017-10-12: 60 mg via INTRAMUSCULAR
  Filled 2017-10-12: qty 2

## 2017-10-12 MED ORDER — DIPHENHYDRAMINE HCL 50 MG/ML IJ SOLN: 25.0000 mg | Freq: Once | INTRAMUSCULAR | Status: DC
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
## 2017-10-12 NOTE — ED Notes (Signed)
Pt educated about not driving or performing other critical tasks (such as operating heavy machinery, caring for infant/toddler/child) due to sedative nature of medications received in ED. Also warned about risks of consuming alcohol or taking other medications with sedative properties. Pt/caregiver verbalized understanding.  

## 2017-10-12 NOTE — ED Triage Notes (Signed)
C/o body aches and HA x 2 months-NAD-steady gait

## 2017-10-12 NOTE — Discharge Instructions (Signed)
Please call the number on your discharge paperwork to get established with a primary care provider.  Please have them recheck your liver enzymes since they were elevated during your emergency department visit in August.  You can take over-the-counter medications like Tylenol or ibuprofen as directed on the bottle if your symptoms persist.  If you develop new or worsening symptoms, including vomiting, the worst headache of your life, visual changes, chest pain, or other new concerning symptoms, please return to the emergency department for reevaluation.

## 2018-01-12 ENCOUNTER — Ambulatory Visit: Payer: Self-pay | Admitting: Family Medicine

## 2018-05-31 ENCOUNTER — Other Ambulatory Visit: Payer: Self-pay

## 2018-05-31 ENCOUNTER — Emergency Department (HOSPITAL_COMMUNITY)
Admission: EM | Admit: 2018-05-31 | Discharge: 2018-05-31 | Disposition: A | Discharge status: Home / Self Care | Payer: Self-pay | Attending: Emergency Medicine | Admitting: Emergency Medicine

## 2018-05-31 ENCOUNTER — Encounter (HOSPITAL_COMMUNITY): Payer: Self-pay | Admitting: *Deleted

## 2018-05-31 DIAGNOSIS — F17210 Nicotine dependence, cigarettes, uncomplicated: Secondary | ICD-10-CM | POA: Insufficient documentation

## 2018-05-31 DIAGNOSIS — I10 Essential (primary) hypertension: Secondary | ICD-10-CM | POA: Insufficient documentation

## 2018-05-31 LAB — URINALYSIS, ROUTINE W REFLEX MICROSCOPIC
Bilirubin Urine: NEGATIVE
GLUCOSE, UA: NEGATIVE mg/dL
HGB URINE DIPSTICK: NEGATIVE
Ketones, ur: NEGATIVE mg/dL
Leukocytes, UA: NEGATIVE
Nitrite: NEGATIVE
PROTEIN: NEGATIVE mg/dL
Specific Gravity, Urine: 1.015 (ref 1.005–1.030)
pH: 6 (ref 5.0–8.0)

## 2018-05-31 MED ORDER — AMLODIPINE BESYLATE 5 MG PO TABS: 5.0000 mg | ORAL_TABLET | Freq: Every day | ORAL | 0 refills | Status: DC

## 2018-05-31 NOTE — ED Notes (Signed)
Reports that for about 6 months when he sits up will get dizzy, get headaches, body aches. Pt reports that he has [facility] for his intermittent HTN and other complaints and told nothing wrong with him. Denies having a PCP to follow up with.

## 2018-05-31 NOTE — Care Management Note (Signed)
Case Management Note  CM consulted for no pcp and no ins with need for follow up and medication assistance.  [facility] for [facility] and financial counseling.  CM updated Lyla Sonarrie, Charity fundraiserN.  No further CM needs noted at this time.

## 2018-05-31 NOTE — ED Triage Notes (Signed)
Pt complains of back pain for the past 3 months. Pt complains of flank pain and headache for the past month.

## 2018-05-31 NOTE — ED Provider Notes (Signed)
[facility]-EMERGENCY DEPT Provider Note   CSN: [id number] Arrival date & time: 05/31/18  4098     History   Chief Complaint Chief Complaint  Patient presents with  . Back Pain  . Headache  . Flank Pain    HPI Joseph Bautista is a 32 y.o. male.  32 year old male presents that for the past 6 months he has had intermittent episodes of lower back pain with occasional radiation down his legs.  No bowel or bladder dysfunction.  Pain is sharp and positional.  No rashes.  No dark or bloody urine.  Also has had high blood pressure which she is not been treated for.  Denies any anginal or CHF symptoms.  No treatment used prior to arrival.  No severe headaches.     Past Medical History:  Diagnosis Date  . Bipolar 1 disorder (HCC)   . Depression   . Hypertension   . Schizophrenia (HCC)     There are no active problems to display for this patient.   History reviewed. No pertinent surgical history.      Home Medications    Prior to Admission medications   Medication Sig Start Date End Date Taking? Authorizing Provider  Sertraline HCl (ZOLOFT PO) Take by mouth.    [provider]  UNKNOWN TO PATIENT "bipolar med"    [provider]    Family History Family History  Problem Relation Age of Onset  . Diabetes Mother   . Hypertension Mother     Social History Social History   Tobacco Use  . Smoking status: Current Every Day Smoker    Types: Cigarettes  . Smokeless tobacco: Never Used  Substance Use Topics  . Alcohol use: Yes    Comment: weekly  . Drug use: Yes    Types: Marijuana     Allergies   Tomato   Review of Systems Review of Systems  All other systems reviewed and are negative.    Physical Exam Updated Vital Signs BP (!) 147/105 (BP Location: Right Arm)   Pulse 69   Temp 97.7 F (36.5 C) (Oral)   Resp 17   Ht 1.88 m (6\' 2" )   Wt 103 kg (227 lb)   SpO2 99%   BMI 29.15 kg/m   Physical Exam    Constitutional: He is oriented to person, place, and time. He appears well-developed and well-nourished.  Non-toxic appearance. No distress.  HENT:  Head: Normocephalic and atraumatic.  Eyes: Pupils are equal, round, and reactive to light. Conjunctivae, EOM and lids are normal.  Neck: Normal range of motion. Neck supple. No tracheal deviation present. No thyroid mass present.  Cardiovascular: Normal rate, regular rhythm and normal heart sounds. Exam reveals no gallop.  No murmur heard. Pulmonary/Chest: Effort normal and breath sounds normal. No stridor. No respiratory distress. He has no decreased breath sounds. He has no wheezes. He has no rhonchi. He has no rales.  Abdominal: Soft. Normal appearance and bowel sounds are normal. He exhibits no distension. There is no tenderness. There is no rebound and no CVA tenderness.  Musculoskeletal: Normal range of motion. He exhibits no edema or tenderness.       Back:  Neurological: He is alert and oriented to person, place, and time. He has normal strength. No cranial nerve deficit or sensory deficit. GCS eye subscore is 4. GCS verbal subscore is 5. GCS motor subscore is 6.  Skin: Skin is warm and dry. No abrasion and no rash noted.  Psychiatric: He has a normal mood and affect. His speech is normal and behavior is normal.  Nursing note and vitals reviewed.    ED Treatments / Results  Labs (all labs ordered are listed, but only abnormal results are displayed) Labs Reviewed  URINALYSIS, ROUTINE W REFLEX MICROSCOPIC    EKG None  Radiology No results found.  Procedures Procedures (including critical care time)  Medications Ordered in ED Medications - No data to display   Initial Impression / Assessment and Plan / ED Course  I have reviewed the triage vital signs and the nursing notes.  Pertinent labs & imaging results that were available during my care of the patient were reviewed by me and considered in my medical decision making  (see chart for details).    Patient likely muscular skeletal back pain.  Urinalysis is negative for protein or blood.  Blood pressure noted we will start patient on Norvasc and he will follow-up with a primary care doctor.  Final Clinical Impressions(s) / ED Diagnoses   Final diagnoses:  None    ED Discharge Orders    None       Lorre NickAllen, Kyser Wandel, MD 05/31/18 1000

## 2018-06-01 ENCOUNTER — Encounter (HOSPITAL_COMMUNITY): Payer: Self-pay | Admitting: Emergency Medicine

## 2018-06-01 ENCOUNTER — Emergency Department (HOSPITAL_COMMUNITY)
Admission: EM | Admit: 2018-06-01 | Discharge: 2018-06-02 | Disposition: A | Discharge status: Home / Self Care | Payer: Self-pay | Attending: Emergency Medicine | Admitting: Emergency Medicine

## 2018-06-01 ENCOUNTER — Emergency Department (HOSPITAL_COMMUNITY): Payer: Self-pay

## 2018-06-01 ENCOUNTER — Other Ambulatory Visit: Payer: Self-pay

## 2018-06-01 DIAGNOSIS — R079 Chest pain, unspecified: Secondary | ICD-10-CM | POA: Insufficient documentation

## 2018-06-01 DIAGNOSIS — F17210 Nicotine dependence, cigarettes, uncomplicated: Secondary | ICD-10-CM | POA: Insufficient documentation

## 2018-06-01 DIAGNOSIS — M791 Myalgia, unspecified site: Secondary | ICD-10-CM | POA: Insufficient documentation

## 2018-06-01 DIAGNOSIS — I10 Essential (primary) hypertension: Secondary | ICD-10-CM | POA: Insufficient documentation

## 2018-06-01 LAB — CBC
HCT: 44.8 % (ref 39.0–52.0)
Hemoglobin: 14.7 g/dL (ref 13.0–17.0)
MCH: 31.1 pg (ref 26.0–34.0)
MCHC: 32.8 g/dL (ref 30.0–36.0)
MCV: 94.7 fL (ref 78.0–100.0)
Platelets: 200 10*3/uL (ref 150–400)
RBC: 4.73 MIL/uL (ref 4.22–5.81)
RDW: 12.7 % (ref 11.5–15.5)
WBC: 8.7 10*3/uL (ref 4.0–10.5)

## 2018-06-01 LAB — BASIC METABOLIC PANEL
Anion gap: 7 (ref 5–15)
BUN: 18 mg/dL (ref 6–20)
CO2: 29 mmol/L (ref 22–32)
Calcium: 9.1 mg/dL (ref 8.9–10.3)
Chloride: 104 mmol/L (ref 101–111)
Creatinine, Ser: 1.12 mg/dL (ref 0.61–1.24)
GFR calc non Af Amer: >60 mL/min (ref >60)
Glucose, Bld: 118 mg/dL — ABNORMAL HIGH (ref 65–99)
Potassium: 4.2 mmol/L (ref 3.5–5.1)
Sodium: 140 mmol/L (ref 135–145)

## 2018-06-01 LAB — I-STAT TROPONIN, ED: TROPONIN I, POC: 0 ng/mL (ref 0.00–0.08)

## 2018-06-01 NOTE — ED Triage Notes (Signed)
Patient to ED for "sharp pains all over body" - initially stating the pain was intermittently in his chest for a few months, including now, but during triage, the pain went to his R underarm and he then went on to say it would go to his back, legs, and stomach. Patient states it feels like the same pain but it goes everywhere. [facility] and [facility] BP medication, which he started this morning. This afternoon, he reports an episode of lightheadedness and SOB that has since resolved. Skin warm/dry, resp e/u.

## 2018-06-02 MED ORDER — IBUPROFEN 600 MG PO TABS: 600.0000 mg | ORAL_TABLET | Freq: Four times a day (QID) | ORAL | 0 refills | Status: DC | PRN

## 2018-06-02 NOTE — ED Provider Notes (Signed)
[facility] EMERGENCY DEPARTMENT Provider Note   CSN: [id number] Arrival date & time: 06/01/18  2146     History   Chief Complaint Chief Complaint  Patient presents with  . Chest Pain    HPI Joseph Bautista is a 32 y.o. male.  Patient presents with concern for sharp pain in various locations including right side torso, chest, head. The pain moves around to these different locations and has been for "months" starting in his chest. He was seen yesterday with complaint of back pain and reports his pain was the same as tonight.   The history is provided by the patient. No language interpreter was used.  Chest Pain   Pertinent negatives include no cough, no fever, no nausea, no shortness of breath and no vomiting.    Past Medical History:  Diagnosis Date  . Bipolar 1 disorder (HCC)   . Depression   . Hypertension   . Schizophrenia (HCC)     There are no active problems to display for this patient.   History reviewed. No pertinent surgical history.      Home Medications    Prior to Admission medications   Medication Sig Start Date End Date Taking? Authorizing Provider  amLODipine (NORVASC) 5 MG tablet Take 1 tablet (5 mg total) by mouth daily. 05/31/18  Yes Lorre Nick, MD  Multiple Vitamin (MULTIVITAMIN WITH MINERALS) TABS tablet Take 1 tablet by mouth daily.   Yes [provider]    Family History Family History  Problem Relation Age of Onset  . Diabetes Mother   . Hypertension Mother     Social History Social History   Tobacco Use  . Smoking status: Current Every Day Smoker    Types: Cigarettes  . Smokeless tobacco: Never Used  Substance Use Topics  . Alcohol use: Yes    Comment: weekly  . Drug use: Yes    Types: Marijuana     Allergies   Tomato   Review of Systems Review of Systems  Constitutional: Negative for chills and fever.  HENT: Negative.   Respiratory: Negative.  Negative for cough and shortness  of breath.   Cardiovascular: Positive for chest pain (See HPI.).  Gastrointestinal: Negative.  Negative for nausea and vomiting.  Musculoskeletal: Positive for myalgias. Negative for arthralgias.  Skin: Negative.   Neurological: Negative.      Physical Exam Updated Vital Signs BP 131/78 (BP Location: Right Arm)   Pulse 76   Temp 98.3 F (36.8 C) (Oral)   Resp 18   Ht 6\' 2"  (1.88 m)   Wt 104.3 kg (230 lb)   SpO2 95%   BMI 29.53 kg/m   Physical Exam  Constitutional: He is oriented to person, place, and time. He appears well-developed and well-nourished.  Patient is very comfortable appearing, in NAD.  HENT:  Head: Normocephalic.  Neck: Normal range of motion. Neck supple.  Cardiovascular: Normal rate and regular rhythm.  No murmur heard. Pulmonary/Chest: Effort normal and breath sounds normal.  Abdominal: Soft. Bowel sounds are normal. There is no tenderness. There is no rebound and no guarding.  Musculoskeletal: Normal range of motion.  No tenderness that is reproducible to palpation. No swelling. FROM all extremities.  Neurological: He is alert and oriented to person, place, and time.  Skin: Skin is warm and dry. No rash noted.  Psychiatric: He has a normal mood and affect.  Nursing note and vitals reviewed.    ED Treatments / Results  Labs (all labs  ordered are listed, but only abnormal results are displayed) Labs Reviewed  BASIC METABOLIC PANEL - Abnormal; Notable for the following components:      Result Value   Glucose, Bld 118 (*)    All other components within normal limits  CBC  I-STAT TROPONIN, ED   Results for orders placed or performed during the hospital encounter of 06/01/18  Basic metabolic panel  Result Value Ref Range   Sodium 140 135 - 145 mmol/L   Potassium 4.2 3.5 - 5.1 mmol/L   Chloride 104 101 - 111 mmol/L   CO2 29 22 - 32 mmol/L   Glucose, Bld 118 (H) 65 - 99 mg/dL   BUN 18 6 - 20 mg/dL   Creatinine, Ser 7.821.12 0.61 - 1.24 mg/dL    Calcium 9.1 8.9 - 95.610.3 mg/dL   GFR calc non Af Amer >60 >60 mL/min   GFR calc Af Amer >60 >60 mL/min   Anion gap 7 5 - 15  CBC  Result Value Ref Range   WBC 8.7 4.0 - 10.5 K/uL   RBC 4.73 4.22 - 5.81 MIL/uL   Hemoglobin 14.7 13.0 - 17.0 g/dL   HCT 21.344.8 08.639.0 - 57.852.0 %   MCV 94.7 78.0 - 100.0 fL   MCH 31.1 26.0 - 34.0 pg   MCHC 32.8 30.0 - 36.0 g/dL   RDW 46.912.7 62.911.5 - 52.815.5 %   Platelets 200 150 - 400 K/uL  I-stat troponin, ED  Result Value Ref Range   Troponin i, poc 0.00 0.00 - 0.08 ng/mL   Comment 3           EKG EKG Interpretation  Date/Time:  Tuesday June 01 2018 21:51:39 EDT Ventricular Rate:  70 PR Interval:  158 QRS Duration: 78 QT Interval:  346 QTC Calculation: 373 R Axis:   37 Text Interpretation:  Normal sinus rhythm Cannot rule out Anterior infarct , age undetermined Abnormal ECG No significant change since last tracing Confirmed by Zadie RhineWickline, Donald ([id number]) on 06/02/2018 12:47:12 AM   Radiology Dg Chest 2 View  Result Date: 06/01/2018 CLINICAL DATA:  Intermittent chest pain for a few months. Patient reports lightheadedness and dyspnea which has since resolved today. EXAM: CHEST - 2 VIEW COMPARISON:  07/16/2017 FINDINGS: The heart size and mediastinal contours are within normal limits. Both lungs are clear. The visualized skeletal structures are unremarkable. IMPRESSION: No active cardiopulmonary disease. Electronically Signed   By: Tollie Ethavid  Kwon M.D.   On: 06/01/2018 22:56    Procedures Procedures (including critical care time)  Medications Ordered in ED Medications - No data to display   Initial Impression / Assessment and Plan / ED Course  I have reviewed the triage vital signs and the nursing notes.  Pertinent labs & imaging results that were available during my care of the patient were reviewed by me and considered in my medical decision making (see chart for details).     Patient returns to ED with diffuse, sharp, mobile pain. VSS. He does not appear ill  or uncomfortable.   Labs are normal. EKG, CXR without acute changes. He has been referred to primary care and this is reiterated on this visit. Recommended ibuprofen and patient reassured.   Final Clinical Impressions(s) / ED Diagnoses   Final diagnoses:  None   1. Myalgias  ED Discharge Orders    None       Elpidio AnisUpstill, Nelly Scriven, Cordelia Poche-C 06/02/18 0133    Zadie RhineWickline, Donald, MD 06/02/18 [phone]

## 2018-06-02 NOTE — Discharge Instructions (Addendum)
Call to get established with a primary care doctor for further evaluation of diffuse pain as well as regular management of blood pressure.

## 2018-06-09 ENCOUNTER — Encounter: Payer: Self-pay | Admitting: Family Medicine

## 2018-06-09 ENCOUNTER — Ambulatory Visit (INDEPENDENT_AMBULATORY_CARE_PROVIDER_SITE_OTHER): Payer: Self-pay | Admitting: Family Medicine

## 2018-06-09 VITALS — BP 128/84 | HR 70 | Temp 97.8°F | Ht 74.0 in | Wt 238.0 lb

## 2018-06-09 DIAGNOSIS — R109 Unspecified abdominal pain: Secondary | ICD-10-CM

## 2018-06-09 DIAGNOSIS — R51 Headache: Secondary | ICD-10-CM

## 2018-06-09 DIAGNOSIS — F3131 Bipolar disorder, current episode depressed, mild: Secondary | ICD-10-CM

## 2018-06-09 DIAGNOSIS — Z23 Encounter for immunization: Secondary | ICD-10-CM

## 2018-06-09 DIAGNOSIS — Z09 Encounter for follow-up examination after completed treatment for conditions other than malignant neoplasm: Secondary | ICD-10-CM

## 2018-06-09 DIAGNOSIS — R10A3 Flank pain, bilateral: Secondary | ICD-10-CM

## 2018-06-09 DIAGNOSIS — R519 Headache, unspecified: Secondary | ICD-10-CM

## 2018-06-09 DIAGNOSIS — Z131 Encounter for screening for diabetes mellitus: Secondary | ICD-10-CM

## 2018-06-09 DIAGNOSIS — Z0000 Encounter for general adult medical examination without abnormal findings: Secondary | ICD-10-CM

## 2018-06-09 LAB — POCT URINALYSIS DIP (MANUAL ENTRY)
Bilirubin, UA: NEGATIVE
Blood, UA: NEGATIVE
Glucose, UA: NEGATIVE mg/dL
Ketones, POC UA: NEGATIVE mg/dL
Leukocytes, UA: NEGATIVE
Nitrite, UA: NEGATIVE
Protein Ur, POC: NEGATIVE mg/dL
Spec Grav, UA: 1.025 (ref 1.010–1.025)
Urobilinogen, UA: 0.2 E.U./dL
pH, UA: 5.5 (ref 5.0–8.0)

## 2018-06-09 LAB — POCT GLYCOSYLATED HEMOGLOBIN (HGB A1C): Hemoglobin A1C: 5.4 % (ref 4.0–5.6)

## 2018-06-09 MED ORDER — TOPIRAMATE 25 MG PO TABS: 25.0000 mg | ORAL_TABLET | Freq: Two times a day (BID) | ORAL | 0 refills | Status: DC

## 2018-06-09 NOTE — Progress Notes (Signed)
Subjective:    Patient ID: Joseph Bautista, male    DOB: [birth year], 32 y.o.   MRN: [id number]   PCP: Joseph Ip, NP  Chief Complaint  Patient presents with  . Establish Care  . Hospitalization Follow-up    HPI  Mr. Joseph Bautista has a past medical history of Schizophrenia, Hypertension, Depression, and Bipolar Disorder. He is here today for hospital follow up and to establish care.   Current Status: He has c/o headaches, bilateral flank pain, and pain in legs for about 2 months. He states that he recently helped his mother move 2 months ago and he began to feel pain at that time. He is not taking anything for the pain.   He denies fevers, chills, fatigue, recent infections, weight loss, and night sweats.   He has not had any headaches, visual changes, dizziness, and falls.   No chest pain, heart palpitations, cough and shortness of breath reported.   No reports of GI problems such as nausea, vomiting, diarrhea, and constipation.   He has no reports of blood in stools, dysuria and hematuria.   No has history of depression and anxiety. He is diagnosed with Bi-polar disorder, which he is prescribed Zoloft. He states that he is currently not taking medication at this time because it decreases his Libido. He has a follow up appointment with his counselor at [facility] next week to discuss dose adjustments or other options.   He states that he uses Marijuana 2-3 times week and last used last night.   He is currently applying for '[employer]' card for medical assistance.   Past Medical History:  Diagnosis Date  . Bipolar 1 disorder (HCC)   . Depression   . Hypertension   . Schizophrenia (HCC)     Family History  Problem Relation Age of Onset  . Diabetes Mother   . Hypertension Mother    Social History   Socioeconomic History  . Marital status: Single    Spouse name: Not on file  . Number of children: Not on file  . Years of education: Not on file  . Highest education  level: Not on file  Occupational History  . Not on file  Social Needs  . Financial resource strain: Not on file  . Food insecurity:    Worry: Not on file    Inability: Not on file  . Transportation needs:    Medical: Not on file    Non-medical: Not on file  Tobacco Use  . Smoking status: Former Smoker    Types: Cigarettes  . Smokeless tobacco: Never Used  Substance and Sexual Activity  . Alcohol use: Yes    Comment: weekly  . Drug use: Yes    Types: Marijuana  . Sexual activity: Not on file  Lifestyle  . Physical activity:    Days per week: Not on file    Minutes per session: Not on file  . Stress: Not on file  Relationships  . Social connections:    Talks on phone: Not on file    Gets together: Not on file    Attends religious service: Not on file    Active member of club or organization: Not on file    Attends meetings of clubs or organizations: Not on file    Relationship status: Not on file  . Intimate partner violence:    Fear of current or ex partner: Not on file    Emotionally abused: Not on file    Physically abused:  Not on file    Forced sexual activity: Not on file  Other Topics Concern  . Not on file  Social History Narrative  . Not on file    History reviewed. No pertinent surgical history.   Immunization History  Administered Date(s) Administered  . Tdap 01/28/2016   Current Meds  Medication Sig  . amLODipine (NORVASC) 5 MG tablet Take 1 tablet (5 mg total) by mouth daily.  Marland Kitchen. ibuprofen (ADVIL,MOTRIN) 600 MG tablet Take 1 tablet (600 mg total) by mouth every 6 (six) hours as needed.  . Multiple Vitamin (MULTIVITAMIN WITH MINERALS) TABS tablet Take 1 tablet by mouth daily.    Allergies  Allergen Reactions  . Tomato Swelling and Other (See Comments)    Tongue burns    BP 128/84 (BP Location: Left Arm, Patient Position: Sitting, Cuff Size: Large)   Pulse 70   Temp 97.8 F (36.6 C) (Oral)   Ht 6\' 2"  (1.88 m)   Wt 238 lb (108 kg)   SpO2 98%    BMI 30.56 kg/m   Review of Systems  Constitutional: Negative.   HENT: Negative.   Eyes: Negative.   Respiratory: Negative.   Cardiovascular: Negative.   Gastrointestinal: Negative.   Endocrine: Negative.   Genitourinary: Positive for flank pain (right).  Musculoskeletal: Positive for back pain.  Skin: Negative.   Allergic/Immunologic: Negative.   Neurological: Negative.   Hematological: Negative.   Psychiatric/Behavioral: Negative.    Objective:   Physical Exam  Constitutional: He is oriented to person, place, and time. He appears well-developed and well-nourished.  HENT:  Head: Normocephalic and atraumatic.  Right Ear: External ear normal.  Left Ear: External ear normal.  Nose: Nose normal.  Mouth/Throat: Oropharynx is clear and moist.  Eyes: Pupils are equal, round, and reactive to light. Conjunctivae and EOM are normal.  Neck: Normal range of motion. Neck supple.  Cardiovascular: Normal rate, regular rhythm, normal heart sounds and intact distal pulses.  Pulmonary/Chest: Effort normal and breath sounds normal.  Abdominal: Soft. Bowel sounds are normal.  Musculoskeletal: Normal range of motion.  Neurological: He is alert and oriented to person, place, and time.  Skin: Skin is warm and dry. Capillary refill takes less than 2 seconds.  Psychiatric: He has a normal mood and affect. His behavior is normal. Judgment and thought content normal.  Nursing note and vitals reviewed.  Assessment & Plan:   1. Screening for diabetes mellitus Hgb A1c is normal at 5.4 today. Urinalysis is negative.  - POCT glycosylated hemoglobin (Hb A1C) - POCT urinalysis dipstick  2. Health care maintenance - Tdap vaccine greater than or equal to 7yo IM  3. Bipolar affective disorder, currently depressed, mild (HCC) Stable. He is non-compliant with medication beffects Libido. [facility] dose [facility] to Zoloft, which is currently prescribed.   4. Bilateral  flank pain Stable today. He is encouraged to use Ibuprofen as prescribed for increased pain.   5. Headache We will initiate Topamax today to aide in frequent headaches.   6. Follow up He will follow up in 6 weeks.   Meds ordered this encounter  Medications  . topiramate (TOPAMAX) 25 MG tablet    Sig: Take 1 tablet (25 mg total) by mouth 2 (two) times daily.    Dispense:  60 tablet    Refill:  0   Joseph IpNatalie Naethan Bracewell,  MSN, FNP-BC [facility] [address]  [place], [place] [id number] [phone]

## 2018-07-21 ENCOUNTER — Encounter: Payer: Self-pay | Admitting: Family Medicine

## 2018-07-21 ENCOUNTER — Ambulatory Visit (INDEPENDENT_AMBULATORY_CARE_PROVIDER_SITE_OTHER): Payer: Self-pay | Admitting: Family Medicine

## 2018-07-21 VITALS — BP 140/90 | HR 64 | Temp 97.9°F | Ht 74.0 in | Wt 238.0 lb

## 2018-07-21 DIAGNOSIS — Z0000 Encounter for general adult medical examination without abnormal findings: Secondary | ICD-10-CM

## 2018-07-21 DIAGNOSIS — N390 Urinary tract infection, site not specified: Secondary | ICD-10-CM

## 2018-07-21 DIAGNOSIS — R51 Headache: Secondary | ICD-10-CM

## 2018-07-21 DIAGNOSIS — R519 Headache, unspecified: Secondary | ICD-10-CM

## 2018-07-21 DIAGNOSIS — M545 Low back pain, unspecified: Secondary | ICD-10-CM

## 2018-07-21 DIAGNOSIS — I10 Essential (primary) hypertension: Secondary | ICD-10-CM

## 2018-07-21 DIAGNOSIS — Z09 Encounter for follow-up examination after completed treatment for conditions other than malignant neoplasm: Secondary | ICD-10-CM

## 2018-07-21 LAB — POCT URINALYSIS DIP (MANUAL ENTRY)
Bilirubin, UA: NEGATIVE
Blood, UA: NEGATIVE
Glucose, UA: NEGATIVE mg/dL
Ketones, POC UA: NEGATIVE mg/dL
Nitrite, UA: NEGATIVE
Protein Ur, POC: NEGATIVE mg/dL
Spec Grav, UA: 1.02 (ref 1.010–1.025)
Urobilinogen, UA: 0.2 E.U./dL
pH, UA: 6.5 (ref 5.0–8.0)

## 2018-07-21 MED ORDER — SULFAMETHOXAZOLE-TRIMETHOPRIM 800-160 MG PO TABS
1.0000 | ORAL_TABLET | Freq: Two times a day (BID) | ORAL | 0 refills | Status: DC
Start: 2018-07-21 — End: 2018-10-22

## 2018-07-21 MED ORDER — METHOCARBAMOL 500 MG PO TABS: 500.0000 mg | ORAL_TABLET | Freq: Two times a day (BID) | ORAL | 2 refills | Status: DC | PRN

## 2018-07-21 MED ORDER — AMLODIPINE BESYLATE 5 MG PO TABS: 5.0000 mg | ORAL_TABLET | Freq: Every day | ORAL | 3 refills | Status: DC

## 2018-07-21 NOTE — Patient Instructions (Signed)
Methocarbamol tablets What is this medicine? METHOCARBAMOL (meth oh KAR ba mole) helps to relieve pain and stiffness in muscles caused by strains, sprains, or other injury to your muscles. This medicine may be used for other purposes; ask your health care provider or pharmacist if you have questions. COMMON BRAND NAME(S): Robaxin What should I tell my health care provider before I take this medicine? They need to know if you have any of these conditions: -kidney disease -seizures -an unusual or allergic reaction to methocarbamol, other medicines, foods, dyes, or preservatives -pregnant or trying to get pregnant -breast-feeding How should I use this medicine? Take this medicine by mouth with a full glass of water. Follow the directions on the prescription label. Take your medicine at regular intervals. Do not take your medicine more often than directed. Talk to your pediatrician regarding the use of this medicine in children. Special care may be needed. Overdosage: If you think you have taken too much of this medicine contact a poison control center or emergency room at once. NOTE: This medicine is only for you. Do not share this medicine with others. What if I miss a dose? If you miss a dose, take it as soon as you can. If it is almost time for your next dose, take only the next dose. Do not take double or extra doses. What may interact with this medicine? Do not take this medication with any of the following medicines: -narcotic medicines for cough This medicine may also interact with the following medications: -alcohol -antihistamines for allergy, cough and cold -certain medicines for anxiety or sleep -certain medicines for depression like amitriptyline, fluoxetine, sertraline -certain medicines for seizures like phenobarbital, primidone -cholinesterase inhibitors like neostigmine, ambenonium, and pyridostigmine bromide -general anesthetics like halothane, isoflurane, methoxyflurane,  propofol -local anesthetics like lidocaine, pramoxine, tetracaine -medicines that relax muscles for surgery -narcotic medicines for pain -phenothiazines like chlorpromazine, mesoridazine, prochlorperazine, thioridazine This list may not describe all possible interactions. Give your health care provider a list of all the medicines, herbs, non-prescription drugs, or dietary supplements you use. Also tell them if you smoke, drink alcohol, or use illegal drugs. Some items may interact with your medicine. What should I watch for while using this medicine? Tell your doctor or health care professional if your symptoms do not start to get better or if they get worse. You may get drowsy or dizzy. Do not drive, use machinery, or do anything that needs mental alertness until you know how this medicine affects you. Do not stand or sit up quickly, especially if you are an older patient. This reduces the risk of dizzy or fainting spells. Alcohol may interfere with the effect of this medicine. Avoid alcoholic drinks. If you are taking another medicine that also causes drowsiness, you may have more side effects. Give your health care provider a list of all medicines you use. Your doctor will tell you how much medicine to take. Do not take more medicine than directed. Call emergency for help if you have problems breathing or unusual sleepiness. What side effects may I notice from receiving this medicine? Side effects that you should report to your doctor or health care professional as soon as possible: -allergic reactions like skin rash, itching or hives, swelling of the face, lips, or tongue -breathing problems -confusion -seizures -unusually weak or tired Side effects that usually do not require medical attention (report to your doctor or health care professional if they continue or are bothersome): -dizziness -headache -metallic taste -tiredness -upset   stomach This list may not describe all possible side  effects. Call your doctor for medical advice about side effects. You may report side effects to FDA at 1-800-FDA-1088. Where should I keep my medicine? Keep out of the reach of children. Store at room temperature between 20 and 25 degrees C (68 and 77 degrees F). Keep container tightly closed. Throw away any unused medicine after the expiration date. NOTE: This sheet is a summary. It may not cover all possible information. If you have questions about this medicine, talk to your doctor, pharmacist, or health care provider.  2018 Elsevier/Gold Standard (2015-09-11 13:11:54)  

## 2018-07-21 NOTE — Progress Notes (Signed)
Follow Up  Subjective:    Patient ID: Joseph Bautista, male    DOB: [birth year], 32 y.o.   MRN: [id number]   Chief Complaint  Patient presents with  . Follow-up    6 weeks    HPI  Mr. Joseph Bautista is Schizophrenia, Hypertension, Depression, and Bipolar Disorder. He is here for follow up.   Current Status: Since his last office visit, he is doing well.  He denies fevers, chills, fatigue, recent infections, weight loss, and night sweats. He has not had any headaches, visual changes, dizziness, and falls. No chest pain, heart palpitations, cough and shortness of breath reported. No reports of GI problems such as nausea, vomiting, diarrhea, and constipation. He has no reports of blood in stools, dysuria and hematuria.   He states that his anxiety is stable today. He denies suicidal ideations, homicidal ideations, or auditory hallucinations. He has increased back pain today.   Past Medical History:  Diagnosis Date  . Bipolar 1 disorder (HCC)   . Depression   . Hypertension   . Schizophrenia (HCC)     Family History  Problem Relation Age of Onset  . Diabetes Mother   . Hypertension Mother     Social History   Socioeconomic History  . Marital status: Single    Spouse name: Not on file  . Number of children: Not on file  . Years of education: Not on file  . Highest education level: Not on file  Occupational History  . Not on file  Social Needs  . Financial resource strain: Not on file  . Food insecurity:    Worry: Not on file    Inability: Not on file  . Transportation needs:    Medical: Not on file    Non-medical: Not on file  Tobacco Use  . Smoking status: Former Smoker    Types: Cigarettes  . Smokeless tobacco: Never Used  Substance and Sexual Activity  . Alcohol use: Yes    Comment: weekly  . Drug use: Yes    Types: Marijuana  . Sexual activity: Not on file  Lifestyle  . Physical activity:    Days per week: Not on file    Minutes per session: Not on file  .  Stress: Not on file  Relationships  . Social connections:    Talks on phone: Not on file    Gets together: Not on file    Attends religious service: Not on file    Active member of club or organization: Not on file    Attends meetings of clubs or organizations: Not on file    Relationship status: Not on file  . Intimate partner violence:    Fear of current or ex partner: Not on file    Emotionally abused: Not on file    Physically abused: Not on file    Forced sexual activity: Not on file  Other Topics Concern  . Not on file  Social History Narrative  . Not on file    History reviewed. No pertinent surgical history.   Immunization History  Administered Date(s) Administered  . Tdap 01/28/2016, 06/09/2018    Current Meds  Medication Sig  . amLODipine (NORVASC) 5 MG tablet Take 1 tablet (5 mg total) by mouth daily.  Marland Kitchen. ibuprofen (ADVIL,MOTRIN) 600 MG tablet Take 1 tablet (600 mg total) by mouth every 6 (six) hours as needed.  . Multiple Vitamin (MULTIVITAMIN WITH MINERALS) TABS tablet Take 1 tablet by mouth daily.  . [DISCONTINUED] amLODipine (NORVASC)  5 MG tablet Take 1 tablet (5 mg total) by mouth daily.   Allergies  Allergen Reactions  . Tomato Swelling and Other (See Comments)    Tongue burns    BP 140/90 (BP Location: Left Arm, Patient Position: Sitting, Cuff Size: Large)   Pulse 64   Temp 97.9 F (36.6 C) (Oral)   Ht 6\' 2"  (1.88 m)   Wt 238 lb (108 kg)   SpO2 98%   BMI 30.56 kg/m   Review of Systems  Constitutional: Negative.   HENT: Negative.   Eyes: Negative.   Respiratory: Negative.   Cardiovascular: Negative.   Gastrointestinal: Negative.   Endocrine: Negative.   Genitourinary: Negative.   Musculoskeletal: Positive for back pain.  Skin: Negative.   Allergic/Immunologic: Negative.   Neurological: Negative.   Hematological: Negative.   Psychiatric/Behavioral: Negative.    Objective:   Physical Exam  Constitutional: He is oriented to person,  place, and time. He appears well-developed and well-nourished.  HENT:  Head: Normocephalic and atraumatic.  Right Ear: External ear normal.  Left Ear: External ear normal.  Nose: Nose normal.  Mouth/Throat: Oropharynx is clear and moist.  Eyes: Pupils are equal, round, and reactive to light. Conjunctivae and EOM are normal.  Neck: Normal range of motion. Neck supple.  Cardiovascular: Normal rate, regular rhythm, normal heart sounds and intact distal pulses.  Pulmonary/Chest: Effort normal and breath sounds normal.  Abdominal: Soft. Bowel sounds are normal.  Musculoskeletal: Normal range of motion.  Neurological: He is alert and oriented to person, place, and time.  Skin: Skin is warm and dry. Capillary refill takes less than 2 seconds.  Psychiatric: He has a normal mood and affect. His behavior is normal. Judgment and thought content normal.  Nursing note and vitals reviewed.  Assessment & Plan:   1. Essential hypertension Blood pressure is 140/90 today. He states that he has not taken bp medication X 1 week. Refill for Norvasc to pharmacy today.  - amLODipine (NORVASC) 5 MG tablet; Take 1 tablet (5 mg total) by mouth daily.  Dispense: 30 tablet; Refill: 3  2. Low back pain without sciatica, unspecified back pain laterality, unspecified chronicity Moderate pain today. We will prescribe Robaxin for back pain today.  - methocarbamol (ROBAXIN) 500 MG tablet; Take 1 tablet (500 mg total) by mouth 2 (two) times daily as needed for muscle spasms.  Dispense: 60 tablet; Refill: 2  3. Nonintractable headache, unspecified chronicity pattern, unspecified headache type Stable. He will continue Motrin or Topamax as prescribed.   4. Health care maintenance - POCT urinalysis dipstick  5. Follow up He will follow up in 3 months.   Meds ordered this encounter  Medications  . DISCONTD: methocarbamol (ROBAXIN) 500 MG tablet    Sig: Take 1 tablet (500 mg total) by mouth 2 (two) times daily as  needed for muscle spasms.    Dispense:  60 tablet    Refill:  2  . methocarbamol (ROBAXIN) 500 MG tablet    Sig: Take 1 tablet (500 mg total) by mouth 2 (two) times daily as needed for muscle spasms.    Dispense:  60 tablet    Refill:  2  . amLODipine (NORVASC) 5 MG tablet    Sig: Take 1 tablet (5 mg total) by mouth daily.    Dispense:  30 tablet    Refill:  3   Raliegh Ip,  MSN, FNP-C Patient [facility] [address] [place], [place] [address] [phone]

## 2018-10-09 ENCOUNTER — Other Ambulatory Visit: Payer: Self-pay | Admitting: Family Medicine

## 2018-10-09 DIAGNOSIS — I10 Essential (primary) hypertension: Secondary | ICD-10-CM

## 2018-10-22 ENCOUNTER — Ambulatory Visit (INDEPENDENT_AMBULATORY_CARE_PROVIDER_SITE_OTHER): Payer: Self-pay | Admitting: Family Medicine

## 2018-10-22 ENCOUNTER — Encounter: Payer: Self-pay | Admitting: Family Medicine

## 2018-10-22 VITALS — BP 126/88 | HR 78 | Temp 97.6°F | Ht 74.0 in | Wt 240.0 lb

## 2018-10-22 DIAGNOSIS — R519 Headache, unspecified: Secondary | ICD-10-CM

## 2018-10-22 DIAGNOSIS — F3131 Bipolar disorder, current episode depressed, mild: Secondary | ICD-10-CM

## 2018-10-22 DIAGNOSIS — M545 Low back pain, unspecified: Secondary | ICD-10-CM

## 2018-10-22 DIAGNOSIS — R51 Headache: Secondary | ICD-10-CM

## 2018-10-22 DIAGNOSIS — I10 Essential (primary) hypertension: Secondary | ICD-10-CM

## 2018-10-22 DIAGNOSIS — Z09 Encounter for follow-up examination after completed treatment for conditions other than malignant neoplasm: Secondary | ICD-10-CM

## 2018-10-22 LAB — POCT URINALYSIS DIP (MANUAL ENTRY)
Bilirubin, UA: NEGATIVE
Blood, UA: NEGATIVE
Glucose, UA: NEGATIVE mg/dL
Ketones, POC UA: NEGATIVE mg/dL
Leukocytes, UA: NEGATIVE
Nitrite, UA: NEGATIVE
Protein Ur, POC: NEGATIVE mg/dL
Spec Grav, UA: 1.01 (ref 1.010–1.025)
Urobilinogen, UA: 0.2 E.U./dL
pH, UA: 6 (ref 5.0–8.0)

## 2018-10-22 MED ORDER — IBUPROFEN 800 MG PO TABS: 800.0000 mg | ORAL_TABLET | Freq: Three times a day (TID) | ORAL | 2 refills | Status: DC | PRN

## 2018-10-22 NOTE — Progress Notes (Signed)
Follow Up  Subjective:    Patient ID: Joseph Bautista, male    DOB: [birth year], 32 y.o.   MRN: [id number]  Chief Complaint  Patient presents with  . Follow-up    3 month on chronic condition  . Back Pain   HPI  Joseph Bautista is a 32 year old male with a past medical history of Schizophrenia, Hypertension, Depression, and Bipolar Disorder. He is here today for follow up assessment of chronic diseases.   Current Status: Since his last office visit, he is doing well with no complaints. He denies fevers, chills, fatigue, recent infections, weight loss, and night sweats. He has not had any headaches, visual changes, dizziness, and falls. No chest pain, heart palpitations, cough and shortness of breath reported. No reports of GI problems such as nausea, vomiting, diarrhea, and constipation. He has no reports of blood in stools, dysuria and hematuria. No depression or anxiety, and denies suicidal ideations, homicidal ideations, or auditory hallucinations. He denies pain today.    Past Medical History:  Diagnosis Date  . Bipolar 1 disorder (HCC)   . Depression   . Hypertension   . Schizophrenia (HCC)     Family History  Problem Relation Age of Onset  . Diabetes Mother   . Hypertension Mother     Social History   Socioeconomic History  . Marital status: Single    Spouse name: Not on file  . Number of children: Not on file  . Years of education: Not on file  . Highest education level: Not on file  Occupational History  . Not on file  Social Needs  . Financial resource strain: Not on file  . Food insecurity:    Worry: Not on file    Inability: Not on file  . Transportation needs:    Medical: Not on file    Non-medical: Not on file  Tobacco Use  . Smoking status: Former Smoker    Types: Cigarettes  . Smokeless tobacco: Never Used  Substance and Sexual Activity  . Alcohol use: Yes    Comment: weekly  . Drug use: Yes    Types: Marijuana  . Sexual activity: Not on file   Lifestyle  . Physical activity:    Days per week: Not on file    Minutes per session: Not on file  . Stress: Not on file  Relationships  . Social connections:    Talks on phone: Not on file    Gets together: Not on file    Attends religious service: Not on file    Active member of club or organization: Not on file    Attends meetings of clubs or organizations: Not on file    Relationship status: Not on file  . Intimate partner violence:    Fear of current or ex partner: Not on file    Emotionally abused: Not on file    Physically abused: Not on file    Forced sexual activity: Not on file  Other Topics Concern  . Not on file  Social History Narrative  . Not on file    History reviewed. No pertinent surgical history.  Immunization History  Administered Date(s) Administered  . Tdap 01/28/2016, 06/09/2018    Current Meds  Medication Sig  . amLODipine (NORVASC) 5 MG tablet Take 1 tablet (5 mg total) by mouth daily.  . methocarbamol (ROBAXIN) 500 MG tablet Take 1 tablet (500 mg total) by mouth 2 (two) times daily as needed for muscle spasms.  Marland Kitchen  Multiple Vitamin (MULTIVITAMIN WITH MINERALS) TABS tablet Take 1 tablet by mouth daily.  . [DISCONTINUED] ibuprofen (ADVIL,MOTRIN) 600 MG tablet Take 1 tablet (600 mg total) by mouth every 6 (six) hours as needed.    Allergies  Allergen Reactions  . Tomato Swelling and Other (See Comments)    Tongue burns    BP 126/88 (BP Location: Right Arm, Patient Position: Sitting, Cuff Size: Large)   Pulse 78   Temp 97.6 F (36.4 C) (Oral)   Ht 6\' 2"  (1.88 m)   Wt 240 lb (108.9 kg)   SpO2 97%   BMI 30.81 kg/m   Review of Systems  Constitutional: Negative.   HENT: Negative.   Eyes: Negative.   Respiratory: Negative.   Cardiovascular: Negative.   Gastrointestinal: Negative.   Endocrine: Negative.   Genitourinary: Negative.   Musculoskeletal: Negative.   Skin: Negative.   Neurological: Negative.   Hematological: Negative.    Psychiatric/Behavioral: Negative.    Objective:   Physical Exam  Constitutional: He is oriented to person, place, and time. He appears well-developed and well-nourished.  HENT:  Head: Normocephalic and atraumatic.  Eyes: Pupils are equal, round, and reactive to light. EOM are normal.  Neck: Normal range of motion. Neck supple.  Cardiovascular: Normal rate, regular rhythm, normal heart sounds and intact distal pulses.  Pulmonary/Chest: Effort normal and breath sounds normal.  Abdominal: Soft. Bowel sounds are normal.  Musculoskeletal: Normal range of motion.  Neurological: He is alert and oriented to person, place, and time.  Skin: Skin is warm and dry.  Psychiatric: He has a normal mood and affect. His behavior is normal. Judgment and thought content normal.  Nursing note and vitals reviewed.  Assessment & Plan:   1. Essential hypertension Antihypertensive medications are effective. Blood pressure is 126/88 today. Continue Amlodipine as prescribed. He will continue to decrease high sodium intake, excessive alcohol intake, increase potassium intake, smoking cessation, and increase physical activity of at least 30 minutes of cardio activity daily. He will continue to follow Heart Healthy or DASH diet. - POCT urinalysis dipstick  2. Low back pain without sciatica, unspecified back pain laterality, unspecified chronicity - ibuprofen (ADVIL,MOTRIN) 800 MG tablet; Take 1 tablet (800 mg total) by mouth every 8 (eight) hours as needed.  Dispense: 30 tablet; Refill: 2  3. Nonintractable headache, unspecified chronicity pattern, unspecified headache type He has no reports of recent headaches. Continue Topamax as needed.   4. Bipolar affective disorder, currently depressed, mild (HCC) Stable today. He smokes Marijuana occasionally for calmness and relaxation.  5. Follow up He will follow up in 6 months.   Meds ordered this encounter  Medications  . ibuprofen (ADVIL,MOTRIN) 800 MG tablet     Sig: Take 1 tablet (800 mg total) by mouth every 8 (eight) hours as needed.    Dispense:  30 tablet    Refill:  2   Raliegh Ip,  MSN, FNP-C [facility] [address] [place], [place] [address] [phone]

## 2018-10-27 ENCOUNTER — Encounter

## 2019-01-24 ENCOUNTER — Ambulatory Visit (INDEPENDENT_AMBULATORY_CARE_PROVIDER_SITE_OTHER): Payer: Self-pay | Admitting: Family Medicine

## 2019-01-24 ENCOUNTER — Encounter: Payer: Self-pay | Admitting: Family Medicine

## 2019-01-24 VITALS — BP 132/88 | HR 68 | Temp 97.9°F | Ht 74.0 in | Wt 235.0 lb

## 2019-01-24 DIAGNOSIS — M545 Low back pain, unspecified: Secondary | ICD-10-CM

## 2019-01-24 DIAGNOSIS — Z09 Encounter for follow-up examination after completed treatment for conditions other than malignant neoplasm: Secondary | ICD-10-CM

## 2019-01-24 DIAGNOSIS — G4700 Insomnia, unspecified: Secondary | ICD-10-CM

## 2019-01-24 DIAGNOSIS — I10 Essential (primary) hypertension: Secondary | ICD-10-CM

## 2019-01-24 DIAGNOSIS — Z131 Encounter for screening for diabetes mellitus: Secondary | ICD-10-CM

## 2019-01-24 DIAGNOSIS — F419 Anxiety disorder, unspecified: Secondary | ICD-10-CM

## 2019-01-24 DIAGNOSIS — R61 Generalized hyperhidrosis: Secondary | ICD-10-CM

## 2019-01-24 LAB — POCT GLYCOSYLATED HEMOGLOBIN (HGB A1C): Hemoglobin A1C: 5.2 % (ref 4.0–5.6)

## 2019-01-24 LAB — POCT URINALYSIS DIP (MANUAL ENTRY)
Bilirubin, UA: NEGATIVE
Blood, UA: NEGATIVE
Glucose, UA: NEGATIVE mg/dL
Ketones, POC UA: NEGATIVE mg/dL
Leukocytes, UA: NEGATIVE
Nitrite, UA: NEGATIVE
Spec Grav, UA: 1.02 (ref 1.010–1.025)
Urobilinogen, UA: 0.2 E.U./dL
pH, UA: 7.5 (ref 5.0–8.0)

## 2019-01-24 NOTE — Progress Notes (Signed)
[facility]  Established Patient Office Visit  Subjective:  Patient ID: Joseph Bautista, male    DOB: Aug 01, 1986  Age: 33 y.o. MRN: [id number]  CC:  Chief Complaint  Patient presents with  . Follow-up    HTN   . Back Pain    HPI Joseph Bautista is a 33 year old male who presents for follow up today.   Past Medical History:  Diagnosis Date  . Bipolar 1 disorder (HCC)   . Depression   . Hypertension   . Schizophrenia (HCC)    Current Status: Since his last office visit, he is having moderate, situational anxiety today. He denies suicidal ideations, homicidal ideations, or auditory hallucinations. He continues to see Psychiatrist every 12 weeks. He states that he continues to smoke Marijuana daily. He has moderate lower back pain.   He denies fevers, chills, fatigue, recent infections, weight loss, and night sweats. He has not had any headaches, visual changes, dizziness, and falls. No chest pain, heart palpitations, cough and shortness of breath reported. No reports of GI problems such as nausea, vomiting, diarrhea, and constipation. He has no reports of blood in stools, dysuria and hematuria.   History reviewed. No pertinent surgical history.  Family History  Problem Relation Age of Onset  . Diabetes Mother   . Hypertension Mother     Social History   Socioeconomic History  . Marital status: Single    Spouse name: Not on file  . Number of children: Not on file  . Years of education: Not on file  . Highest education level: Not on file  Occupational History  . Not on file  Social Needs  . Financial resource strain: Not on file  . Food insecurity:    Worry: Not on file    Inability: Not on file  . Transportation needs:    Medical: Not on file    Non-medical: Not on file  Tobacco Use  . Smoking status: Former Smoker    Types: Cigarettes  . Smokeless tobacco: Never Used  Substance and Sexual Activity    . Alcohol use: Yes    Comment: weekly  . Drug use: Yes    Types: Marijuana  . Sexual activity: Not on file  Lifestyle  . Physical activity:    Days per week: Not on file    Minutes per session: Not on file  . Stress: Not on file  Relationships  . Social connections:    Talks on phone: Not on file    Gets together: Not on file    Attends religious service: Not on file    Active member of club or organization: Not on file    Attends meetings of clubs or organizations: Not on file    Relationship status: Not on file  . Intimate partner violence:    Fear of current or ex partner: Not on file    Emotionally abused: Not on file    Physically abused: Not on file    Forced sexual activity: Not on file  Other Topics Concern  . Not on file  Social History Narrative  . Not on file    Outpatient Medications Prior to Visit  Medication Sig Dispense Refill  . amLODipine (NORVASC) 5 MG tablet Take 1 tablet (5 mg total) by mouth daily. 30 tablet 3  . ibuprofen (ADVIL,MOTRIN) 800 MG tablet Take 1 tablet (800 mg total) by mouth every 8 (eight) hours as needed. 30  tablet 2  . Multiple Vitamin (MULTIVITAMIN WITH MINERALS) TABS tablet Take 1 tablet by mouth daily.    . methocarbamol (ROBAXIN) 500 MG tablet Take 1 tablet (500 mg total) by mouth 2 (two) times daily as needed for muscle spasms. (Patient not taking: Reported on 01/24/2019) 60 tablet 2  . topiramate (TOPAMAX) 25 MG tablet Take 1 tablet (25 mg total) by mouth 2 (two) times daily. (Patient not taking: Reported on 01/24/2019) 60 tablet 0   No facility-administered medications prior to visit.     Allergies  Allergen Reactions  . Tomato Swelling and Other (See Comments)    Tongue burns   ROS Review of Systems  Constitutional: Negative.   HENT: Negative.   Eyes: Negative.   Respiratory: Negative.   Cardiovascular: Negative.   Gastrointestinal: Negative.   Endocrine: Negative.   Genitourinary: Negative.   Musculoskeletal:  Positive for back pain (chronic low back pain).  Skin: Negative.   Allergic/Immunologic: Negative.   Neurological: Positive for dizziness and headaches.  Hematological: Negative.   Psychiatric/Behavioral: Negative.    Objective:   Physical Exam  Constitutional: He is oriented to person, place, and time. He appears well-developed.  HENT:  Head: Normocephalic and atraumatic.  Eyes: Conjunctivae are normal.  Neck: Normal range of motion. Neck supple.  Cardiovascular: Normal rate, regular rhythm, normal heart sounds and intact distal pulses.  Pulmonary/Chest: Effort normal and breath sounds normal.  Abdominal: Soft. Bowel sounds are normal.  Musculoskeletal: Normal range of motion.  Neurological: He is alert and oriented to person, place, and time.  Skin: Skin is warm and dry.  Psychiatric: He has a normal mood and affect. His behavior is normal. Judgment and thought content normal.  Vitals reviewed.  BP 132/88 (BP Location: Left Arm, Patient Position: Sitting, Cuff Size: Large)   Pulse 68   Temp 97.9 F (36.6 C) (Oral)   Ht 6\' 2"  (1.88 m)   Wt 235 lb (106.6 kg)   SpO2 98%   BMI 30.17 kg/m  Wt Readings from Last 3 Encounters:  01/24/19 235 lb (106.6 kg)  10/22/18 240 lb (108.9 kg)  07/21/18 238 lb (108 kg)     There are no preventive care reminders to display for this patient.  There are no preventive care reminders to display for this patient.  No results found for: TSH Lab Results  Component Value Date   WBC 8.7 06/01/2018   HGB 14.7 06/01/2018   HCT 44.8 06/01/2018   MCV 94.7 06/01/2018   PLT 200 06/01/2018   Lab Results  Component Value Date   NA 140 06/01/2018   K 4.2 06/01/2018   CO2 29 06/01/2018   GLUCOSE 118 (H) 06/01/2018   BUN 18 06/01/2018   CREATININE 1.12 06/01/2018   BILITOT 0.8 07/16/2017   ALKPHOS 191 (H) 07/16/2017   AST 52 (H) 07/16/2017   ALT 89 (H) 07/16/2017   PROT 8.1 07/16/2017   ALBUMIN 4.7 07/16/2017   CALCIUM 9.1 06/01/2018    ANIONGAP 7 06/01/2018   No results found for: CHOL No results found for: HDL No results found for: LDLCALC No results found for: TRIG No results found for: [facility] Lab Results  Component Value Date   HGBA1C 5.2 01/24/2019   Assessment & Plan:   1. Anxiety He is currently seeing at [facility] and would like to try another physician.  We will refer him to [facility] today. Monitor.  - Ambulatory referral to Psychiatry  2. Essential hypertension Blood pressure is stable today.  Blood pressure is 132/88 today. He will continue to decrease high sodium intake, excessive alcohol intake, increase potassium intake, smoking cessation, and increase physical activity of at least 30 minutes of cardio activity daily. He will continue to follow Heart Healthy or DASH diet.  3. Low back pain without sciatica, unspecified back pain laterality, unspecified chronicity Continue Motrin as needed.   4. Insomnia, unspecified type Stable.   5. Diaphoresis R/t situational anxiety today.   6. Screening for diabetes mellitus Hgb A1c within normal limits today. He will continue to decrease foods/beverages high in sugars and carbs and follow Heart Healthy or DASH diet. Increase physical activity to at least 30 minutes cardio exercise daily.  - POCT glycosylated hemoglobin (Hb A1C) - POCT urinalysis dipstick  7. Follow up He will keep follow up appointment 04/2019.  No orders of the defined types were placed in this encounter.  Orders Placed This Encounter  Procedures  . Ambulatory referral to Psychiatry  . POCT glycosylated hemoglobin (Hb A1C)  . POCT urinalysis dipstick       Referral Orders     Ambulatory referral to Psychiatry    Raliegh Ip,  MSN, FNP-C [facility] [address] [place], [place] [address] [phone]  Problem List Items Addressed This Visit    None    Visit Diagnoses    Anxiety    -  Primary   Relevant Orders    Ambulatory referral to Psychiatry   Essential hypertension       Low back pain without sciatica, unspecified back pain laterality, unspecified chronicity       Insomnia, unspecified type       Diaphoresis       Screening for diabetes mellitus       Relevant Orders   POCT glycosylated hemoglobin (Hb A1C) (Completed)   POCT urinalysis dipstick (Completed)   Follow up          No orders of the defined types were placed in this encounter.   Follow-up: No follow-ups on file.    Kallie Locks, FNP

## 2019-01-25 DIAGNOSIS — M545 Low back pain, unspecified: Secondary | ICD-10-CM | POA: Insufficient documentation

## 2019-01-25 DIAGNOSIS — F419 Anxiety disorder, unspecified: Secondary | ICD-10-CM | POA: Insufficient documentation

## 2019-01-25 DIAGNOSIS — I10 Essential (primary) hypertension: Secondary | ICD-10-CM | POA: Insufficient documentation

## 2019-04-25 ENCOUNTER — Ambulatory Visit: Payer: Self-pay | Admitting: Family Medicine

## 2019-04-27 ENCOUNTER — Ambulatory Visit: Payer: Self-pay | Admitting: Family Medicine

## 2019-05-05 ENCOUNTER — Telehealth: Payer: Self-pay

## 2019-05-05 NOTE — Telephone Encounter (Signed)
Patient was negative for the COV-19 screening questions. Patient advise that he needs to wear a mask and that we are not allowing visitors

## 2019-05-06 ENCOUNTER — Encounter: Payer: Self-pay | Admitting: Family Medicine

## 2019-05-06 ENCOUNTER — Ambulatory Visit (INDEPENDENT_AMBULATORY_CARE_PROVIDER_SITE_OTHER): Payer: Medicaid Other | Admitting: Family Medicine

## 2019-05-06 ENCOUNTER — Other Ambulatory Visit: Payer: Self-pay

## 2019-05-06 VITALS — BP 134/90 | HR 82 | Temp 97.6°F | Ht 74.0 in | Wt 244.0 lb

## 2019-05-06 DIAGNOSIS — G4700 Insomnia, unspecified: Secondary | ICD-10-CM | POA: Diagnosis not present

## 2019-05-06 DIAGNOSIS — M545 Low back pain, unspecified: Secondary | ICD-10-CM

## 2019-05-06 DIAGNOSIS — F419 Anxiety disorder, unspecified: Secondary | ICD-10-CM | POA: Diagnosis not present

## 2019-05-06 DIAGNOSIS — Z09 Encounter for follow-up examination after completed treatment for conditions other than malignant neoplasm: Secondary | ICD-10-CM

## 2019-05-06 DIAGNOSIS — I10 Essential (primary) hypertension: Secondary | ICD-10-CM | POA: Diagnosis not present

## 2019-05-06 LAB — POCT URINALYSIS DIP (MANUAL ENTRY)
Bilirubin, UA: NEGATIVE
Blood, UA: NEGATIVE
Glucose, UA: NEGATIVE mg/dL
Ketones, POC UA: NEGATIVE mg/dL
Leukocytes, UA: NEGATIVE
Nitrite, UA: NEGATIVE
Protein Ur, POC: NEGATIVE mg/dL
Spec Grav, UA: 1.025 (ref 1.010–1.025)
Urobilinogen, UA: 0.2 E.U./dL
pH, UA: 5.5 (ref 5.0–8.0)

## 2019-05-06 MED ORDER — CYCLOBENZAPRINE HCL 10 MG PO TABS: 10.0000 mg | ORAL_TABLET | Freq: Three times a day (TID) | ORAL | 3 refills | Status: DC | PRN

## 2019-05-06 MED ORDER — IBUPROFEN 800 MG PO TABS: 800.0000 mg | ORAL_TABLET | Freq: Three times a day (TID) | ORAL | 3 refills | Status: DC | PRN

## 2019-05-06 NOTE — Patient Instructions (Signed)
Muscle Cramps and Spasms Muscle cramps and spasms are when muscles tighten by themselves. They usually get better within minutes. Muscle cramps are painful. They are usually stronger and last longer than muscle spasms. Muscle spasms may or may not be painful. They can last a few seconds or much longer. Cramps and spasms can affect any muscle, but they occur most often in the calf muscles of the leg. They are usually not caused by a serious problem. In many cases, the cause is not known. Some common causes include:  Doing more physical work or exercise than your body is ready for.  Using the muscles too much (overuse) by repeating certain movements too many times.  Staying in a certain position for a long time.  Playing a sport or doing an activity without preparing properly.  Using bad form or technique while playing a sport or doing an activity.  Not having enough water in your body (dehydration).  Injury.  Side effects of some medicines.  Low levels of the salts and minerals in your blood (electrolytes), such as low potassium or calcium. Follow these instructions at home: Managing pain and stiffness      Massage, stretch, and relax the muscle. Do this for many minutes at a time.  If told, put heat on tight or tense muscles as often as told by your doctor. Use the heat source that your doctor recommends, such as a moist heat pack or a heating pad. ? Place a towel between your skin and the heat source. ? Leave the heat on for 20-30 minutes. ? Remove the heat if your skin turns bright red. This is very important if you are not able to feel pain, heat, or cold. You may have a greater risk of getting burned.  If told, put ice on the affected area. This may help if you are sore or have pain after a cramp or spasm. ? Put ice in a plastic bag. ? Place a towel between your skin and the bag. ? Leave the ice on for 20 minutes, 2-3 times a day.  Try taking hot showers or baths to help  relax tight muscles. Eating and drinking  Drink enough fluid to keep your pee (urine) pale yellow.  Eat a healthy diet to help ensure that your muscles work well. This should include: ? Fruits and vegetables. ? Lean protein. ? Whole grains. ? Low-fat or nonfat dairy products. General instructions  If you are having cramps often, avoid intense exercise for several days.  Take over-the-counter and prescription medicines only as told by your doctor.  Watch for any changes in your symptoms.  Keep all follow-up visits as told by your doctor. This is important. Contact a doctor if:  Your cramps or spasms get worse or happen more often.  Your cramps or spasms do not get better with time. Summary  Muscle cramps and spasms are when muscles tighten by themselves. They usually get better within minutes.  Cramps and spasms occur most often in the calf muscles of the leg.  Massage, stretch, and relax the muscle. This may help the cramp or spasm go away.  Drink enough fluid to keep your pee (urine) pale yellow. This information is not intended to replace advice given to you by your health care provider. Make sure you discuss any questions you have with your health care provider. Document Released: 11/13/2008 Document Revised: 04/26/2018 Document Reviewed: 04/26/2018 Elsevier Interactive Patient Education  2019 [employer] Inc[employer] Cyclobenzaprine tablets What is this  medicine? CYCLOBENZAPRINE (sye kloe BEN za preen) is a muscle relaxer. It is used to treat muscle pain, spasms, and stiffness. This medicine may be used for other purposes; ask your health care provider or pharmacist if you have questions. COMMON BRAND NAME(S): Fexmid, Flexeril What should I tell my health care provider before I take this medicine? They need to know if you have any of these conditions: -heart disease, irregular heartbeat, or previous heart attack -liver disease -thyroid problem -an unusual or allergic reaction  to cyclobenzaprine, tricyclic antidepressants, lactose, other medicines, foods, dyes, or preservatives -pregnant or trying to get pregnant -breast-feeding How should I use this medicine? Take this medicine by mouth with a glass of water. Follow the directions on the prescription label. If this medicine upsets your stomach, take it with food or milk. Take your medicine at regular intervals. Do not take it more often than directed. Talk to your pediatrician regarding the use of this medicine in children. Special care may be needed. Overdosage: If you think you have taken too much of this medicine contact a poison control center or emergency room at once. NOTE: This medicine is only for you. Do not share this medicine with others. What if I miss a dose? If you miss a dose, take it as soon as you can. If it is almost time for your next dose, take only that dose. Do not take double or extra doses. What may interact with this medicine? Do not take this medicine with any of the following medications: -MAOIs like Carbex, Eldepryl, Marplan, Nardil, and Parnate This medicine may also interact with the following medications: -alcohol -antihistamines for allergy, cough, and cold -certain medicines for anxiety or sleep -certain medicines for depression like amitriptyline, fluoxetine, sertraline -certain medicines for seizures like phenobarbital, primidone -contrast dyes -local anesthetics like lidocaine, pramoxine, tetracaine -medicines that relax muscles for surgery -narcotic medicines for pain -phenothiazines like chlorpromazine, mesoridazine, prochlorperazine This list may not describe all possible interactions. Give your health care provider a list of all the medicines, herbs, non-prescription drugs, or dietary supplements you use. Also tell them if you smoke, drink alcohol, or use illegal drugs. Some items may interact with your medicine. What should I watch for while using this medicine? Tell your  doctor or health care professional if your symptoms do not start to get better or if they get worse. You may get drowsy or dizzy. Do not drive, use machinery, or do anything that needs mental alertness until you know how this medicine affects you. Do not stand or sit up quickly, especially if you are an older patient. This reduces the risk of dizzy or fainting spells. Alcohol may interfere with the effect of this medicine. Avoid alcoholic drinks. If you are taking another medicine that also causes drowsiness, you may have more side effects. Give your health care provider a list of all medicines you use. Your doctor will tell you how much medicine to take. Do not take more medicine than directed. Call emergency for help if you have problems breathing or unusual sleepiness. Your mouth may get dry. Chewing sugarless gum or sucking hard candy, and drinking plenty of water may help. Contact your doctor if the problem does not go away or is severe. What side effects may I notice from receiving this medicine? Side effects that you should report to your doctor or health care professional as soon as possible: -allergic reactions like skin rash, itching or hives, swelling of the face, lips, or tongue -breathing  problems -chest pain -fast, irregular heartbeat -hallucinations -seizures -unusually weak or tired Side effects that usually do not require medical attention (report to your doctor or health care professional if they continue or are bothersome): -headache -nausea, vomiting This list may not describe all possible side effects. Call your doctor for medical advice about side effects. You may report side effects to FDA at 1-800-FDA-1088. Where should I keep my medicine? Keep out of the reach of children. Store at room temperature between 15 and 30 degrees C (59 and 86 degrees F). Keep container tightly closed. Throw away any unused medicine after the expiration date. NOTE: This sheet is a summary. It  may not cover all possible information. If you have questions about this medicine, talk to your doctor, pharmacist, or health care provider.  2019 Elsevier/Gold Standard (2017-09-23 13:04:35)

## 2019-05-06 NOTE — Progress Notes (Signed)
po

## 2019-05-06 NOTE — Progress Notes (Signed)
[facility]   Established Patient Office Visit  Subjective:  Patient ID: Joseph FenderChristopher L Dennin, male    DOB: February 05, 1986  Age: 33 y.o. MRN: [id number]  CC:  Chief Complaint  Patient presents with  . Follow-up    chronic condtion     HPI Joseph Bautista is a 33 year old male who presents for follow up today.   Past Medical History:  Diagnosis Date  . Bipolar 1 disorder (HCC)   . Depression   . Hypertension   . Schizophrenia (HCC)    Current Status: Since his last office visit, he is doing well with no complaints. He denies visual changes, chest pain, cough, shortness of breath, heart palpitations, and falls. He has occasional headaches and dizziness with position changes. Denies severe headaches, confusion, seizures, double vision, and blurred vision, nausea and vomiting. His anxiety is mild today. He denies suicidal ideations, homicidal ideations, or auditory hallucinations. He continues to see Psychiatrist every 3 months. He has chronic back pain.   He denies fevers, chills, fatigue, recent infections, weight loss, and night sweats. No reports of GI problems such as vomiting, diarrhea, and constipation. He has no reports of blood in stools, dysuria and hematuria.    History reviewed. No pertinent surgical history.  Family History  Problem Relation Age of Onset  . Diabetes Mother   . Hypertension Mother     Social History   Socioeconomic History  . Marital status: Single    Spouse name: Not on file  . Number of children: Not on file  . Years of education: Not on file  . Highest education level: Not on file  Occupational History  . Not on file  Social Needs  . Financial resource strain: Not on file  . Food insecurity:    Worry: Not on file    Inability: Not on file  . Transportation needs:    Medical: Not on file    Non-medical: Not on file  Tobacco Use  . Smoking status: Former Smoker    Types: Cigarettes   . Smokeless tobacco: Never Used  Substance and Sexual Activity  . Alcohol use: Yes    Comment: weekly  . Drug use: Yes    Types: Marijuana  . Sexual activity: Not on file  Lifestyle  . Physical activity:    Days per week: Not on file    Minutes per session: Not on file  . Stress: Not on file  Relationships  . Social connections:    Talks on phone: Not on file    Gets together: Not on file    Attends religious service: Not on file    Active member of club or organization: Not on file    Attends meetings of clubs or organizations: Not on file    Relationship status: Not on file  . Intimate partner violence:    Fear of current or ex partner: Not on file    Emotionally abused: Not on file    Physically abused: Not on file    Forced sexual activity: Not on file  Other Topics Concern  . Not on file  Social History Narrative  . Not on file    Outpatient Medications Prior to Visit  Medication Sig Dispense Refill  . amLODipine (NORVASC) 5 MG tablet Take 1 tablet (5 mg total) by mouth daily. 30 tablet 3  . lamoTRIgine (LAMICTAL) 150 MG tablet Take 150 mg by mouth daily.    .Marland Kitchen  Multiple Vitamin (MULTIVITAMIN WITH MINERALS) TABS tablet Take 1 tablet by mouth daily.    . sertraline (ZOLOFT) 50 MG tablet Take 50 mg by mouth daily.    Marland Kitchen topiramate (TOPAMAX) 25 MG tablet Take 1 tablet (25 mg total) by mouth 2 (two) times daily. 60 tablet 0  . ibuprofen (ADVIL,MOTRIN) 800 MG tablet Take 1 tablet (800 mg total) by mouth every 8 (eight) hours as needed. 30 tablet 2  . lamoTRIgine (LAMICTAL) 100 MG tablet Take 100 mg by mouth daily.    . methocarbamol (ROBAXIN) 500 MG tablet Take 1 tablet (500 mg total) by mouth 2 (two) times daily as needed for muscle spasms. (Patient not taking: Reported on 01/24/2019) 60 tablet 2   No facility-administered medications prior to visit.     Allergies  Allergen Reactions  . Tomato Swelling and Other (See Comments)    Tongue burns    ROS Review of Systems   Constitutional: Negative.   HENT: Negative.   Eyes: Negative.   Respiratory: Negative.   Cardiovascular: Negative.   Gastrointestinal: Negative.   Endocrine: Negative.   Genitourinary: Negative.   Musculoskeletal: Positive for back pain (chronic ).  Skin: Negative.   Allergic/Immunologic: Negative.   Neurological: Positive for dizziness and headaches.  Hematological: Negative.   Psychiatric/Behavioral: Negative.    Objective:    Physical Exam  Constitutional: He is oriented to person, place, and time. He appears well-developed and well-nourished.  HENT:  Head: Normocephalic and atraumatic.  Eyes: Conjunctivae are normal.  Neck: Normal range of motion. Neck supple.  Cardiovascular: Normal rate, regular rhythm, normal heart sounds and intact distal pulses.  Pulmonary/Chest: Effort normal and breath sounds normal.  Abdominal: Soft. Bowel sounds are normal.  Musculoskeletal:     Comments: Limited ROM in lower back.   Neurological: He is alert and oriented to person, place, and time. He has normal reflexes.  Skin: Skin is warm and dry.  Psychiatric: He has a normal mood and affect. His behavior is normal. Judgment and thought content normal.  Nursing note and vitals reviewed.   BP 134/90 (BP Location: Left Arm, Patient Position: Sitting, Cuff Size: Large)   Pulse 82   Temp 97.6 F (36.4 C) (Oral)   Ht 6\' 2"  (1.88 m)   Wt 244 lb (110.7 kg)   SpO2 98%   BMI 31.33 kg/m  Wt Readings from Last 3 Encounters:  05/06/19 244 lb (110.7 kg)  01/24/19 235 lb (106.6 kg)  10/22/18 240 lb (108.9 kg)     There are no preventive care reminders to display for this patient.  There are no preventive care reminders to display for this patient.  No results found for: TSH Lab Results  Component Value Date   WBC 6.8 05/06/2019   HGB 15.7 05/06/2019   HCT 46.7 05/06/2019   MCV 94 05/06/2019   PLT 177 05/06/2019   Lab Results  Component Value Date   NA 140 05/06/2019   K 4.3  05/06/2019   CO2 24 05/06/2019   GLUCOSE 99 05/06/2019   BUN 10 05/06/2019   CREATININE 0.96 05/06/2019   BILITOT 0.3 05/06/2019   ALKPHOS 134 (H) 05/06/2019   AST 26 05/06/2019   ALT 54 (H) 05/06/2019   PROT 7.4 05/06/2019   ALBUMIN 4.7 05/06/2019   CALCIUM 9.6 05/06/2019   ANIONGAP 7 06/01/2018   No results found for: CHOL No results found for: HDL No results found for: LDLCALC No results found for: TRIG No results found for:  CHOLHDL Lab Results  Component Value Date   HGBA1C 5.2 01/24/2019   Assessment & Plan:   1. Essential hypertension The current medical regimen is effective; blood pressure is stable at 134/90 today; continue present plan and medications as prescribed. She will continue to decrease high sodium intake, excessive alcohol intake, increase potassium intake, smoking cessation, and increase physical activity of at least 30 minutes of cardio activity daily. She will continue to follow Heart Healthy or DASH diet. - POCT urinalysis dipstick - CBC with Differential - Comprehensive metabolic panel  2. Anxiety Continue anti-anxiety medications as prescribed. Continue to follow up with Psychiatrist as needed.   3. Low back pain without sciatica, unspecified back pain laterality, unspecified chronicity We will initiate Flexeril and Motrin today.  - cyclobenzaprine (FLEXERIL) 10 MG tablet; Take 1 tablet (10 mg total) by mouth 3 (three) times daily as needed for muscle spasms.  Dispense: 30 tablet; Refill: 3 - ibuprofen (ADVIL) 800 MG tablet; Take 1 tablet (800 mg total) by mouth every 8 (eight) hours as needed.  Dispense: 30 tablet; Refill: 3  4. Insomnia, unspecified type Continue Trazodone as prescribed.   5. Follow up He will follow up in 6 months.    Meds ordered this encounter  Medications  . cyclobenzaprine (FLEXERIL) 10 MG tablet    Sig: Take 1 tablet (10 mg total) by mouth 3 (three) times daily as needed for muscle spasms.    Dispense:  30 tablet     Refill:  3  . ibuprofen (ADVIL) 800 MG tablet    Sig: Take 1 tablet (800 mg total) by mouth every 8 (eight) hours as needed.    Dispense:  30 tablet    Refill:  3    Orders Placed This Encounter  Procedures  . CBC with Differential  . Comprehensive metabolic panel  . POCT urinalysis dipstick    Referral Orders  No referral(s) requested today    Raliegh Ip,  MSN, FNP-C [facility] [address] [place], [place] [address] [phone]   Problem List Items Addressed This Visit      Cardiovascular and Mediastinum   Essential hypertension - Primary   Relevant Orders   POCT urinalysis dipstick (Completed)   CBC with Differential (Completed)   Comprehensive metabolic panel (Completed)     Other   Anxiety   Relevant Medications   sertraline (ZOLOFT) 50 MG tablet   Low back pain without sciatica   Relevant Medications   cyclobenzaprine (FLEXERIL) 10 MG tablet   ibuprofen (ADVIL) 800 MG tablet    Other Visit Diagnoses    Insomnia, unspecified type       Follow up          Meds ordered this encounter  Medications  . cyclobenzaprine (FLEXERIL) 10 MG tablet    Sig: Take 1 tablet (10 mg total) by mouth 3 (three) times daily as needed for muscle spasms.    Dispense:  30 tablet    Refill:  3  . ibuprofen (ADVIL) 800 MG tablet    Sig: Take 1 tablet (800 mg total) by mouth every 8 (eight) hours as needed.    Dispense:  30 tablet    Refill:  3    Follow-up: Return in about 6 months (around 11/06/2019).    Kallie Locks, FNP

## 2019-05-07 LAB — COMPREHENSIVE METABOLIC PANEL
ALT: 54 IU/L — ABNORMAL HIGH (ref 0–44)
AST: 26 IU/L (ref 0–40)
Albumin/Globulin Ratio: 1.7 (ref 1.2–2.2)
Albumin: 4.7 g/dL (ref 4.0–5.0)
Alkaline Phosphatase: 134 IU/L — ABNORMAL HIGH (ref 39–117)
BUN/Creatinine Ratio: 10 (ref 9–20)
BUN: 10 mg/dL (ref 6–20)
Bilirubin Total: 0.3 mg/dL (ref 0.0–1.2)
CO2: 24 mmol/L (ref 20–29)
Calcium: 9.6 mg/dL (ref 8.7–10.2)
Chloride: 102 mmol/L (ref 96–106)
Creatinine, Ser: 0.96 mg/dL (ref 0.76–1.27)
GFR calc Af Amer: 119 mL/min/{1.73_m2} (ref >59)
GFR calc non Af Amer: 103 mL/min/{1.73_m2} (ref >59)
Globulin, Total: 2.7 g/dL (ref 1.5–4.5)
Glucose: 99 mg/dL (ref 65–99)
Potassium: 4.3 mmol/L (ref 3.5–5.2)
Sodium: 140 mmol/L (ref 134–144)
Total Protein: 7.4 g/dL (ref 6.0–8.5)

## 2019-05-07 LAB — CBC WITH DIFFERENTIAL/PLATELET
Basophils Absolute: 0 x10E3/uL (ref 0.0–0.2)
Basos: 0 %
EOS (ABSOLUTE): 0.1 x10E3/uL (ref 0.0–0.4)
Eos: 1 %
Hematocrit: 46.7 % (ref 37.5–51.0)
Hemoglobin: 15.7 g/dL (ref 13.0–17.7)
Immature Grans (Abs): 0 x10E3/uL (ref 0.0–0.1)
Immature Granulocytes: 0 %
Lymphocytes Absolute: 2.3 x10E3/uL (ref 0.7–3.1)
Lymphs: 33 %
MCH: 31.5 pg (ref 26.6–33.0)
MCHC: 33.6 g/dL (ref 31.5–35.7)
MCV: 94 fL (ref 79–97)
Monocytes Absolute: 0.3 x10E3/uL (ref 0.1–0.9)
Monocytes: 5 %
Neutrophils Absolute: 4.1 x10E3/uL (ref 1.4–7.0)
Neutrophils: 61 %
Platelets: 177 x10E3/uL (ref 150–450)
RBC: 4.98 x10E6/uL (ref 4.14–5.80)
RDW: 13.4 % (ref 11.6–15.4)
WBC: 6.8 x10E3/uL (ref 3.4–10.8)

## 2019-05-08 DIAGNOSIS — G4700 Insomnia, unspecified: Secondary | ICD-10-CM | POA: Insufficient documentation

## 2019-05-19 NOTE — Telephone Encounter (Signed)
Note not needed 

## 2019-09-02 ENCOUNTER — Emergency Department (HOSPITAL_COMMUNITY)
Admission: EM | Admit: 2019-09-02 | Discharge: 2019-09-02 | Disposition: A | Discharge status: Home / Self Care | Payer: Medicaid Other | Attending: Emergency Medicine | Admitting: Emergency Medicine

## 2019-09-02 ENCOUNTER — Other Ambulatory Visit: Payer: Self-pay

## 2019-09-02 ENCOUNTER — Emergency Department (HOSPITAL_COMMUNITY): Payer: Medicaid Other

## 2019-09-02 DIAGNOSIS — Y929 Unspecified place or not applicable: Secondary | ICD-10-CM | POA: Insufficient documentation

## 2019-09-02 DIAGNOSIS — I10 Essential (primary) hypertension: Secondary | ICD-10-CM | POA: Diagnosis not present

## 2019-09-02 DIAGNOSIS — S299XXA Unspecified injury of thorax, initial encounter: Secondary | ICD-10-CM | POA: Diagnosis present

## 2019-09-02 DIAGNOSIS — Y999 Unspecified external cause status: Secondary | ICD-10-CM | POA: Diagnosis not present

## 2019-09-02 DIAGNOSIS — H1131 Conjunctival hemorrhage, right eye: Secondary | ICD-10-CM

## 2019-09-02 DIAGNOSIS — Z79899 Other long term (current) drug therapy: Secondary | ICD-10-CM | POA: Diagnosis not present

## 2019-09-02 DIAGNOSIS — Y939 Activity, unspecified: Secondary | ICD-10-CM | POA: Insufficient documentation

## 2019-09-02 DIAGNOSIS — S20229A Contusion of unspecified back wall of thorax, initial encounter: Secondary | ICD-10-CM

## 2019-09-02 DIAGNOSIS — S20211A Contusion of right front wall of thorax, initial encounter: Secondary | ICD-10-CM

## 2019-09-02 DIAGNOSIS — S300XXA Contusion of lower back and pelvis, initial encounter: Secondary | ICD-10-CM | POA: Diagnosis not present

## 2019-09-02 DIAGNOSIS — Z87891 Personal history of nicotine dependence: Secondary | ICD-10-CM | POA: Diagnosis not present

## 2019-09-02 MED ORDER — DICLOFENAC SODIUM 75 MG PO TBEC: 75.0000 mg | DELAYED_RELEASE_TABLET | Freq: Two times a day (BID) | ORAL | 0 refills | Status: DC

## 2019-09-02 MED ORDER — METHOCARBAMOL 500 MG PO TABS: 500.0000 mg | ORAL_TABLET | Freq: Four times a day (QID) | ORAL | 0 refills | Status: DC
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
## 2019-09-02 NOTE — Discharge Instructions (Signed)
Return if any problems.
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
## 2019-09-07 ENCOUNTER — Telehealth: Payer: Self-pay | Admitting: Family Medicine

## 2019-09-07 ENCOUNTER — Other Ambulatory Visit: Payer: Self-pay

## 2019-09-07 DIAGNOSIS — I10 Essential (primary) hypertension: Secondary | ICD-10-CM

## 2019-09-07 MED ORDER — AMLODIPINE BESYLATE 5 MG PO TABS: 5.0000 mg | ORAL_TABLET | Freq: Every day | ORAL | 3 refills | Status: DC

## 2019-09-07 NOTE — Telephone Encounter (Signed)
Sent into the pharmacy.  

## 2019-11-07 ENCOUNTER — Ambulatory Visit (INDEPENDENT_AMBULATORY_CARE_PROVIDER_SITE_OTHER): Payer: Medicaid Other | Admitting: Family Medicine

## 2019-11-07 ENCOUNTER — Encounter: Payer: Self-pay | Admitting: Family Medicine

## 2019-11-07 ENCOUNTER — Other Ambulatory Visit: Payer: Self-pay

## 2019-11-07 VITALS — BP 133/89 | HR 67 | Temp 97.9°F | Ht 74.0 in | Wt 245.2 lb

## 2019-11-07 DIAGNOSIS — Z0000 Encounter for general adult medical examination without abnormal findings: Secondary | ICD-10-CM | POA: Diagnosis not present

## 2019-11-07 DIAGNOSIS — M545 Low back pain, unspecified: Secondary | ICD-10-CM

## 2019-11-07 DIAGNOSIS — M62838 Other muscle spasm: Secondary | ICD-10-CM

## 2019-11-07 DIAGNOSIS — M549 Dorsalgia, unspecified: Secondary | ICD-10-CM

## 2019-11-07 DIAGNOSIS — G8929 Other chronic pain: Secondary | ICD-10-CM

## 2019-11-07 DIAGNOSIS — F419 Anxiety disorder, unspecified: Secondary | ICD-10-CM

## 2019-11-07 DIAGNOSIS — Z09 Encounter for follow-up examination after completed treatment for conditions other than malignant neoplasm: Secondary | ICD-10-CM

## 2019-11-07 DIAGNOSIS — I10 Essential (primary) hypertension: Secondary | ICD-10-CM | POA: Diagnosis not present

## 2019-11-07 DIAGNOSIS — G4700 Insomnia, unspecified: Secondary | ICD-10-CM | POA: Diagnosis not present

## 2019-11-07 DIAGNOSIS — R519 Headache, unspecified: Secondary | ICD-10-CM

## 2019-11-07 DIAGNOSIS — T148XXA Other injury of unspecified body region, initial encounter: Secondary | ICD-10-CM

## 2019-11-07 LAB — POCT URINALYSIS DIPSTICK
Bilirubin, UA: NEGATIVE
Blood, UA: NEGATIVE
Glucose, UA: NEGATIVE
Ketones, UA: NEGATIVE
Nitrite, UA: NEGATIVE
Protein, UA: NEGATIVE
Spec Grav, UA: 1.025 (ref 1.010–1.025)
Urobilinogen, UA: 0.2 E.U./dL
pH, UA: 6.5 (ref 5.0–8.0)

## 2019-11-07 LAB — POCT GLYCOSYLATED HEMOGLOBIN (HGB A1C): Hemoglobin A1C: 5.4 % (ref 4.0–5.6)

## 2019-11-07 LAB — GLUCOSE, POCT (MANUAL RESULT ENTRY): POC Glucose: 136 mg/dl — AB (ref 70–99)

## 2019-11-07 MED ORDER — CYCLOBENZAPRINE HCL 10 MG PO TABS: 10.0000 mg | ORAL_TABLET | Freq: Every day | ORAL | 3 refills | Status: DC

## 2019-11-07 NOTE — Patient Instructions (Signed)
Muscle Strain A muscle strain is an injury that happens when a muscle is stretched longer than normal. This can happen during a fall, sports, or lifting. This can tear some muscle fibers. Usually, recovery from muscle strain takes 1-2 weeks. Complete healing normally takes 5-6 weeks. This condition is first treated with PRICE therapy. This involves:  Protecting your muscle from being injured again.  Resting your injured muscle.  Icing your injured muscle.  Applying pressure (compression) to your injured muscle. This may be done with a splint or elastic bandage.  Raising (elevating) your injured muscle. Your doctor may also recommend medicine for pain. Follow these instructions at home: If you have a splint:  Wear the splint as told by your doctor. Take it off only as told by your doctor.  Loosen the splint if your fingers or toes tingle, get numb, or turn cold and blue.  Keep the splint clean.  If the splint is not waterproof: ? Do not let it get wet. ? Cover it with a watertight covering when you take a bath or a shower. Managing pain, stiffness, and swelling   If directed, put ice on your injured area. ? If you have a removable splint, take it off as told by your doctor. ? Put ice in a plastic bag. ? Place a towel between your skin and the bag. ? Leave the ice on for 20 minutes, 2-3 times a day.  Move your fingers or toes often. This helps to avoid stiffness and lessen swelling.  Raise your injured area above the level of your heart while you are sitting or lying down.  Wear an elastic bandage as told by your doctor. Make sure it is not too tight. General instructions  Take over-the-counter and prescription medicines only as told by your doctor.  Limit your activity. Rest your injured muscle as told by your doctor. Your doctor may say that gentle movements are okay.  If physical therapy was prescribed, do exercises as told by your doctor.  Do not put pressure on any  part of the splint until it is fully hardened. This may take many hours.  Do not use any products that contain nicotine or tobacco, such as cigarettes and e-cigarettes. These can delay bone healing. If you need help quitting, ask your doctor.  Warm up before you exercise. This helps to prevent more muscle strains.  Ask your doctor when it is safe to drive if you have a splint.  Keep all follow-up visits as told by your doctor. This is important. Contact a doctor if:  You have more pain or swelling in your injured area. Get help right away if:  You have any of these problems in your injured area: ? You have numbness. ? You have tingling. ? You lose a lot of strength. Summary  A muscle strain is an injury that happens when a muscle is stretched longer than normal.  This condition is first treated with PRICE therapy. This includes protecting, resting, icing, adding pressure, and raising your injury.  Limit your activity. Rest your injured muscle as told by your doctor. Your doctor may say that gentle movements are okay.  Warm up before you exercise. This helps to prevent more muscle strains. This information is not intended to replace advice given to you by your health care provider. Make sure you discuss any questions you have with your health care provider. Document Released: 09/09/2008 Document Revised: 01/27/2019 Document Reviewed: 01/07/2017 Elsevier Patient Education  2020 [employer]  

## 2019-11-07 NOTE — Progress Notes (Signed)
[facility]   Established Patient Office Visit  Subjective:  Patient ID: Joseph Bautista, male    DOB: May 11, 1986  Age: 33 y.o. MRN: [id number]  CC:  Chief Complaint  Patient presents with  . Follow-up    6 mth follow up HTN    HPI Joseph FenderChristopher L Styles is a 33 year old male who presents for Follow Up today.   Past Medical History:  Diagnosis Date  . Bipolar 1 disorder (HCC)   . Depression   . Hypertension   . Schizophrenia (HCC)    Current Status: Since her last office visit, she is doing well with no complaints.  She denies visual changes, chest pain, cough, shortness of breath, heart palpitations, and falls. She has occasional headaches and dizziness with position changes. Denies severe headaches, confusion, seizures, double vision, and blurred vision, nausea and vomiting. His anxiety is moderate today. He has had follow up appointment with Psychiatry 2 weeks ago.  He denies suicidal ideations, homicidal ideations, or auditory hallucinations. He denies fevers, chills, fatigue, recent infections, weight loss, and night sweats.  No reports of GI problems such as diarrhea, and constipation. He has no reports of blood in stools, dysuria and hematuria. He denies pain today.   History reviewed. No pertinent surgical history.  Family History  Problem Relation Age of Onset  . Diabetes Mother   . Hypertension Mother     Social History   Socioeconomic History  . Marital status: Single    Spouse name: Not on file  . Number of children: Not on file  . Years of education: Not on file  . Highest education level: Not on file  Occupational History  . Not on file  Social Needs  . Financial resource strain: Not on file  . Food insecurity    Worry: Not on file    Inability: Not on file  . Transportation needs    Medical: Not on file    Non-medical: Not on file  Tobacco Use  . Smoking status: Former Smoker    Types:  Cigarettes  . Smokeless tobacco: Never Used  Substance and Sexual Activity  . Alcohol use: Yes    Comment: RARE  . Drug use: Yes    Types: Marijuana  . Sexual activity: Yes  Lifestyle  . Physical activity    Days per week: Not on file    Minutes per session: Not on file  . Stress: Not on file  Relationships  . Social Musicianconnections    Talks on phone: Not on file    Gets together: Not on file    Attends religious service: Not on file    Active member of club or organization: Not on file    Attends meetings of clubs or organizations: Not on file    Relationship status: Not on file  . Intimate partner violence    Fear of current or ex partner: Not on file    Emotionally abused: Not on file    Physically abused: Not on file    Forced sexual activity: Not on file  Other Topics Concern  . Not on file  Social History Narrative  . Not on file    Outpatient Medications Prior to Visit  Medication Sig Dispense Refill  . amLODipine (NORVASC) 5 MG tablet Take 1 tablet (5 mg total) by mouth daily. 30 tablet 3  . lamoTRIgine (LAMICTAL) 150 MG tablet Take 150 mg by mouth daily.    .Marland Kitchen  methocarbamol (ROBAXIN) 500 MG tablet Take 1 tablet (500 mg total) by mouth 4 (four) times daily. 20 tablet 0  . Multiple Vitamin (MULTIVITAMIN WITH MINERALS) TABS tablet Take 1 tablet by mouth daily.    . sertraline (ZOLOFT) 50 MG tablet Take 50 mg by mouth daily.    . diclofenac (VOLTAREN) 75 MG EC tablet Take 1 tablet (75 mg total) by mouth 2 (two) times daily. (Patient not taking: Reported on 11/07/2019) 20 tablet 0   No facility-administered medications prior to visit.     Allergies  Allergen Reactions  . Tomato Swelling and Other (See Comments)    Tongue burns    ROS Review of Systems  Constitutional: Negative.   HENT: Negative.   Eyes: Negative.   Respiratory: Negative.   Cardiovascular: Negative.   Gastrointestinal: Negative.   Endocrine: Negative.   Genitourinary: Negative.    Musculoskeletal: Positive for back pain (chronic).  Skin: Negative.   Allergic/Immunologic: Negative.   Neurological: Negative.   Hematological: Negative.   Psychiatric/Behavioral: Negative.    Objective:    Physical Exam  Constitutional: He is oriented to person, place, and time. He appears well-developed and well-nourished.  HENT:  Head: Normocephalic and atraumatic.  Eyes: Conjunctivae are normal.  Neck: Normal range of motion. Neck supple.  Cardiovascular: Normal rate, regular rhythm, normal heart sounds and intact distal pulses.  Pulmonary/Chest: Effort normal and breath sounds normal.  Abdominal: Soft. Bowel sounds are normal.  Musculoskeletal: Normal range of motion.  Neurological: He is alert and oriented to person, place, and time. He has normal reflexes.  Skin: Skin is warm and dry.  Psychiatric: He has a normal mood and affect. His behavior is normal. Judgment and thought content normal.  Nursing note and vitals reviewed.   BP 133/89   Pulse 67   Temp 97.9 F (36.6 C) (Oral)   Ht 6\' 2"  (1.88 m)   Wt 245 lb 3.2 oz (111.2 kg)   SpO2 96%   BMI 31.48 kg/m  Wt Readings from Last 3 Encounters:  11/07/19 245 lb 3.2 oz (111.2 kg)  09/02/19 230 lb (104.3 kg)  05/06/19 244 lb (110.7 kg)     Health Maintenance Due  Topic Date Due  . INFLUENZA VACCINE  07/16/2019    There are no preventive care reminders to display for this patient.  No results found for: TSH Lab Results  Component Value Date   WBC 6.8 05/06/2019   HGB 15.7 05/06/2019   HCT 46.7 05/06/2019   MCV 94 05/06/2019   PLT 177 05/06/2019   Lab Results  Component Value Date   NA 140 05/06/2019   K 4.3 05/06/2019   CO2 24 05/06/2019   GLUCOSE 99 05/06/2019   BUN 10 05/06/2019   CREATININE 0.96 05/06/2019   BILITOT 0.3 05/06/2019   ALKPHOS 134 (H) 05/06/2019   AST 26 05/06/2019   ALT 54 (H) 05/06/2019   PROT 7.4 05/06/2019   ALBUMIN 4.7 05/06/2019   CALCIUM 9.6 05/06/2019   ANIONGAP 7  06/01/2018   No results found for: CHOL No results found for: HDL No results found for: LDLCALC No results found for: TRIG No results found for: [facility] Lab Results  Component Value Date   HGBA1C 5.4 11/07/2019   Assessment & Plan:   1. Essential hypertension The current medical regimen is effective; blood pressure is stable at 133/89 today; continue present plan and medications as prescribed. He will continue to take medications as prescribed, to decrease high sodium intake, excessive  alcohol intake, increase potassium intake, smoking cessation, and increase physical activity of at least 30 minutes of cardio activity daily. He will continue to follow Heart Healthy or DASH diet. - Urinalysis Dipstick - Glucose (CBG)  2. Anxiety Stable today.  3. Insomnia, unspecified type  4. Low back pain without sciatica, unspecified back pain laterality, unspecified chronicity Continue pain medications as prescribed.   5. Health care maintenance - POCT HgB A1C  6. Chronic back pain, unspecified back location, unspecified back pain laterality - Ambulatory referral to Physical Therapy  7. Muscle strain - cyclobenzaprine (FLEXERIL) 10 MG tablet; Take 1 tablet (10 mg total) by mouth at bedtime.  Dispense: 30 tablet; Refill: 3  8. Muscle spasm We will initiate Flexeril today.  - cyclobenzaprine (FLEXERIL) 10 MG tablet; Take 1 tablet (10 mg total) by mouth at bedtime.  Dispense: 30 tablet; Refill: 3  9. Nonintractable headache, unspecified chronicity pattern, unspecified headache type Stable today.  10. Follow up He will follow up in 6 months.   Meds ordered this encounter  Medications  . cyclobenzaprine (FLEXERIL) 10 MG tablet    Sig: Take 1 tablet (10 mg total) by mouth at bedtime.    Dispense:  30 tablet    Refill:  3    Orders Placed This Encounter  Procedures  . Ambulatory referral to Physical Therapy  . Urinalysis Dipstick  . Glucose (CBG)  . POCT HgB A1C     Referral  Orders     Ambulatory referral to Physical Therapy   Raliegh Ip,  MSN, FNP-BC [facility] [address] [place], [place] [address] [phone] fax  Problem List Items Addressed This Visit      Cardiovascular and Mediastinum   Essential hypertension - Primary   Relevant Orders   Urinalysis Dipstick (Completed)   Glucose (CBG) (Completed)     Other   Anxiety   Insomnia   Low back pain without sciatica   Relevant Medications   cyclobenzaprine (FLEXERIL) 10 MG tablet    Other Visit Diagnoses    Health care maintenance       Relevant Orders   POCT HgB A1C (Completed)   Chronic back pain, unspecified back location, unspecified back pain laterality       Relevant Medications   cyclobenzaprine (FLEXERIL) 10 MG tablet   Other Relevant Orders   Ambulatory referral to Physical Therapy   Muscle strain       Relevant Medications   cyclobenzaprine (FLEXERIL) 10 MG tablet   Muscle spasm       Relevant Medications   cyclobenzaprine (FLEXERIL) 10 MG tablet   Nonintractable headache, unspecified chronicity pattern, unspecified headache type       Relevant Medications   cyclobenzaprine (FLEXERIL) 10 MG tablet   Follow up          Meds ordered this encounter  Medications  . cyclobenzaprine (FLEXERIL) 10 MG tablet    Sig: Take 1 tablet (10 mg total) by mouth at bedtime.    Dispense:  30 tablet    Refill:  3    Follow-up: No follow-ups on file.    Kallie Locks, FNP

## 2019-11-08 DIAGNOSIS — M62838 Other muscle spasm: Secondary | ICD-10-CM | POA: Insufficient documentation

## 2019-11-08 DIAGNOSIS — T148XXA Other injury of unspecified body region, initial encounter: Secondary | ICD-10-CM | POA: Insufficient documentation

## 2019-11-08 DIAGNOSIS — G8929 Other chronic pain: Secondary | ICD-10-CM | POA: Insufficient documentation

## 2019-11-08 DIAGNOSIS — R519 Headache, unspecified: Secondary | ICD-10-CM | POA: Insufficient documentation

## 2019-12-06 ENCOUNTER — Encounter: Payer: Self-pay | Admitting: Physical Therapy

## 2019-12-06 ENCOUNTER — Ambulatory Visit: Payer: Medicaid Other | Attending: Family Medicine | Admitting: Physical Therapy

## 2019-12-06 ENCOUNTER — Other Ambulatory Visit: Payer: Self-pay

## 2019-12-06 DIAGNOSIS — R2689 Other abnormalities of gait and mobility: Secondary | ICD-10-CM | POA: Insufficient documentation

## 2019-12-06 DIAGNOSIS — R293 Abnormal posture: Secondary | ICD-10-CM | POA: Insufficient documentation

## 2019-12-06 DIAGNOSIS — M545 Low back pain: Secondary | ICD-10-CM | POA: Insufficient documentation

## 2019-12-06 DIAGNOSIS — G8929 Other chronic pain: Secondary | ICD-10-CM | POA: Insufficient documentation
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
## 2019-12-22 ENCOUNTER — Ambulatory Visit: Payer: Medicaid Other | Attending: Family Medicine | Admitting: Physical Therapy

## 2019-12-22 ENCOUNTER — Encounter: Payer: Self-pay | Admitting: Physical Therapy

## 2019-12-22 ENCOUNTER — Other Ambulatory Visit: Payer: Self-pay

## 2019-12-22 DIAGNOSIS — M545 Low back pain, unspecified: Secondary | ICD-10-CM

## 2019-12-22 DIAGNOSIS — G8929 Other chronic pain: Secondary | ICD-10-CM | POA: Diagnosis present

## 2019-12-22 DIAGNOSIS — R293 Abnormal posture: Secondary | ICD-10-CM | POA: Diagnosis present

## 2019-12-22 DIAGNOSIS — R2689 Other abnormalities of gait and mobility: Secondary | ICD-10-CM
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
## 2019-12-29 ENCOUNTER — Ambulatory Visit: Payer: Medicaid Other | Admitting: Physical Therapy

## 2019-12-29 ENCOUNTER — Other Ambulatory Visit: Payer: Self-pay

## 2019-12-29 ENCOUNTER — Encounter: Payer: Self-pay | Admitting: Physical Therapy

## 2019-12-29 DIAGNOSIS — R2689 Other abnormalities of gait and mobility: Secondary | ICD-10-CM

## 2019-12-29 DIAGNOSIS — G8929 Other chronic pain: Secondary | ICD-10-CM

## 2019-12-29 DIAGNOSIS — R293 Abnormal posture: Secondary | ICD-10-CM

## 2019-12-29 DIAGNOSIS — M545 Low back pain, unspecified: Secondary | ICD-10-CM
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
## 2020-01-05 ENCOUNTER — Ambulatory Visit: Payer: Medicaid Other | Admitting: Physical Therapy

## 2020-01-05 ENCOUNTER — Other Ambulatory Visit: Payer: Self-pay

## 2020-01-05 DIAGNOSIS — M545 Low back pain, unspecified: Secondary | ICD-10-CM

## 2020-01-05 DIAGNOSIS — G8929 Other chronic pain: Secondary | ICD-10-CM

## 2020-01-05 DIAGNOSIS — R2689 Other abnormalities of gait and mobility: Secondary | ICD-10-CM

## 2020-01-05 DIAGNOSIS — R293 Abnormal posture: Secondary | ICD-10-CM
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
## 2020-01-18 ENCOUNTER — Ambulatory Visit: Payer: Medicaid Other | Attending: Family Medicine | Admitting: Physical Therapy

## 2020-01-18 ENCOUNTER — Other Ambulatory Visit: Payer: Self-pay

## 2020-01-18 ENCOUNTER — Encounter: Payer: Self-pay | Admitting: Physical Therapy

## 2020-01-18 DIAGNOSIS — R293 Abnormal posture: Secondary | ICD-10-CM | POA: Diagnosis present

## 2020-01-18 DIAGNOSIS — G8929 Other chronic pain: Secondary | ICD-10-CM | POA: Diagnosis present

## 2020-01-18 DIAGNOSIS — M545 Low back pain: Secondary | ICD-10-CM | POA: Diagnosis present

## 2020-01-18 DIAGNOSIS — R2689 Other abnormalities of gait and mobility: Secondary | ICD-10-CM | POA: Diagnosis present

## 2020-01-20 ENCOUNTER — Encounter: Payer: Self-pay | Admitting: Physical Therapy
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
## 2020-01-26 ENCOUNTER — Ambulatory Visit: Payer: Medicaid Other | Admitting: Physical Therapy

## 2020-01-26 ENCOUNTER — Encounter: Payer: Self-pay | Admitting: Physical Therapy

## 2020-01-26 ENCOUNTER — Other Ambulatory Visit: Payer: Self-pay

## 2020-01-26 DIAGNOSIS — G8929 Other chronic pain: Secondary | ICD-10-CM

## 2020-01-26 DIAGNOSIS — R2689 Other abnormalities of gait and mobility: Secondary | ICD-10-CM

## 2020-01-26 DIAGNOSIS — R293 Abnormal posture: Secondary | ICD-10-CM

## 2020-01-26 DIAGNOSIS — M545 Low back pain: Secondary | ICD-10-CM | POA: Diagnosis not present
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
## 2020-02-02 ENCOUNTER — Ambulatory Visit: Payer: Medicaid Other | Admitting: Physical Therapy

## 2020-02-08 ENCOUNTER — Other Ambulatory Visit: Payer: Self-pay | Admitting: Family Medicine

## 2020-02-08 DIAGNOSIS — I10 Essential (primary) hypertension: Secondary | ICD-10-CM

## 2020-02-09 ENCOUNTER — Ambulatory Visit: Payer: Medicaid Other | Admitting: Physical Therapy

## 2020-02-09 ENCOUNTER — Other Ambulatory Visit: Payer: Self-pay

## 2020-02-09 ENCOUNTER — Encounter: Payer: Self-pay | Admitting: Physical Therapy

## 2020-02-09 DIAGNOSIS — M545 Low back pain, unspecified: Secondary | ICD-10-CM

## 2020-02-09 DIAGNOSIS — R2689 Other abnormalities of gait and mobility: Secondary | ICD-10-CM

## 2020-02-09 DIAGNOSIS — G8929 Other chronic pain: Secondary | ICD-10-CM

## 2020-02-09 DIAGNOSIS — R293 Abnormal posture: Secondary | ICD-10-CM
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
## 2020-03-19 ENCOUNTER — Emergency Department (HOSPITAL_COMMUNITY): Payer: Medicaid Other

## 2020-03-19 ENCOUNTER — Observation Stay (HOSPITAL_COMMUNITY)
Admission: EM | Admit: 2020-03-19 | Discharge: 2020-03-20 | Disposition: A | Discharge status: Home / Self Care | Payer: Medicaid Other | Attending: General Surgery | Admitting: General Surgery

## 2020-03-19 ENCOUNTER — Encounter (HOSPITAL_COMMUNITY): Payer: Self-pay

## 2020-03-19 DIAGNOSIS — Y939 Activity, unspecified: Secondary | ICD-10-CM | POA: Diagnosis not present

## 2020-03-19 DIAGNOSIS — Z79899 Other long term (current) drug therapy: Secondary | ICD-10-CM | POA: Diagnosis not present

## 2020-03-19 DIAGNOSIS — I10 Essential (primary) hypertension: Secondary | ICD-10-CM | POA: Insufficient documentation

## 2020-03-19 DIAGNOSIS — T1490XA Injury, unspecified, initial encounter: Secondary | ICD-10-CM

## 2020-03-19 DIAGNOSIS — M79601 Pain in right arm: Secondary | ICD-10-CM | POA: Diagnosis not present

## 2020-03-19 DIAGNOSIS — M79604 Pain in right leg: Secondary | ICD-10-CM | POA: Insufficient documentation

## 2020-03-19 DIAGNOSIS — Z791 Long term (current) use of non-steroidal anti-inflammatories (NSAID): Secondary | ICD-10-CM | POA: Diagnosis not present

## 2020-03-19 DIAGNOSIS — M25551 Pain in right hip: Secondary | ICD-10-CM | POA: Diagnosis not present

## 2020-03-19 DIAGNOSIS — S52601B Unspecified fracture of lower end of right ulna, initial encounter for open fracture type I or II: Secondary | ICD-10-CM | POA: Diagnosis not present

## 2020-03-19 DIAGNOSIS — S31139A Puncture wound of abdominal wall without foreign body, unspecified quadrant without penetration into peritoneal cavity, initial encounter: Secondary | ICD-10-CM | POA: Insufficient documentation

## 2020-03-19 DIAGNOSIS — S71141A Puncture wound with foreign body, right thigh, initial encounter: Secondary | ICD-10-CM | POA: Insufficient documentation

## 2020-03-19 DIAGNOSIS — S31143A Puncture wound of abdominal wall with foreign body, right lower quadrant without penetration into peritoneal cavity, initial encounter: Secondary | ICD-10-CM | POA: Insufficient documentation

## 2020-03-19 DIAGNOSIS — U071 COVID-19: Secondary | ICD-10-CM | POA: Insufficient documentation

## 2020-03-19 DIAGNOSIS — W3400XA Accidental discharge from unspecified firearms or gun, initial encounter: Secondary | ICD-10-CM

## 2020-03-19 DIAGNOSIS — F319 Bipolar disorder, unspecified: Secondary | ICD-10-CM | POA: Diagnosis not present

## 2020-03-19 DIAGNOSIS — E876 Hypokalemia: Secondary | ICD-10-CM | POA: Insufficient documentation

## 2020-03-19 DIAGNOSIS — F1290 Cannabis use, unspecified, uncomplicated: Secondary | ICD-10-CM | POA: Insufficient documentation

## 2020-03-19 LAB — RESPIRATORY PANEL BY RT PCR (FLU A&B, COVID)
Influenza A by PCR: NEGATIVE
Influenza B by PCR: NEGATIVE
SARS Coronavirus 2 by RT PCR: POSITIVE — AB

## 2020-03-19 LAB — COMPREHENSIVE METABOLIC PANEL
ALT: 46 U/L — ABNORMAL HIGH (ref 0–44)
AST: 31 U/L (ref 15–41)
Albumin: 4 g/dL (ref 3.5–5.0)
Alkaline Phosphatase: 97 U/L (ref 38–126)
Anion gap: 17 — ABNORMAL HIGH (ref 5–15)
BUN: 15 mg/dL (ref 6–20)
CO2: 18 mmol/L — ABNORMAL LOW (ref 22–32)
Calcium: 8.9 mg/dL (ref 8.9–10.3)
Chloride: 105 mmol/L (ref 98–111)
Creatinine, Ser: 1.1 mg/dL (ref 0.61–1.24)
GFR calc Af Amer: >60 mL/min (ref >60)
GFR calc non Af Amer: >60 mL/min (ref >60)
Glucose, Bld: 142 mg/dL — ABNORMAL HIGH (ref 70–99)
Potassium: 3.2 mmol/L — ABNORMAL LOW (ref 3.5–5.1)
Sodium: 140 mmol/L (ref 135–145)
Total Bilirubin: 0.6 mg/dL (ref 0.3–1.2)
Total Protein: 6.9 g/dL (ref 6.5–8.1)

## 2020-03-19 LAB — PROTIME-INR
INR: 1 (ref 0.8–1.2)
Prothrombin Time: 13.3 seconds (ref 11.4–15.2)

## 2020-03-19 LAB — I-STAT CHEM 8, ED
BUN: 18 mg/dL (ref 6–20)
Calcium, Ion: 1.05 mmol/L — ABNORMAL LOW (ref 1.15–1.40)
Chloride: 108 mmol/L (ref 98–111)
Creatinine, Ser: 1.1 mg/dL (ref 0.61–1.24)
Glucose, Bld: 133 mg/dL — ABNORMAL HIGH (ref 70–99)
HCT: 43 % (ref 39.0–52.0)
Hemoglobin: 14.6 g/dL (ref 13.0–17.0)
Potassium: 3.3 mmol/L — ABNORMAL LOW (ref 3.5–5.1)
Sodium: 141 mmol/L (ref 135–145)
TCO2: 18 mmol/L — ABNORMAL LOW (ref 22–32)

## 2020-03-19 LAB — CBC
HCT: 46.3 % (ref 39.0–52.0)
Hemoglobin: 14.5 g/dL (ref 13.0–17.0)
MCH: 31.5 pg (ref 26.0–34.0)
MCHC: 31.3 g/dL (ref 30.0–36.0)
MCV: 100.4 fL — ABNORMAL HIGH (ref 80.0–100.0)
Platelets: 207 K/uL (ref 150–400)
RBC: 4.61 MIL/uL (ref 4.22–5.81)
RDW: 13.6 % (ref 11.5–15.5)
WBC: 11 K/uL — ABNORMAL HIGH (ref 4.0–10.5)
nRBC: 0 % (ref 0.0–0.2)

## 2020-03-19 LAB — ETHANOL: Alcohol, Ethyl (B): 112 mg/dL — ABNORMAL HIGH (ref <10)

## 2020-03-19 LAB — HIV ANTIBODY (ROUTINE TESTING W REFLEX): HIV Screen 4th Generation wRfx: NONREACTIVE

## 2020-03-19 LAB — SAMPLE TO BLOOD BANK

## 2020-03-19 LAB — LACTIC ACID, PLASMA: Lactic Acid, Venous: 8.3 mmol/L (ref 0.5–1.9)

## 2020-03-19 MED ORDER — FENTANYL CITRATE (PF) 100 MCG/2ML IJ SOLN
50.0000 ug | Freq: Once | INTRAMUSCULAR | Status: AC
  Administered 2020-03-19: 50 ug via INTRAVENOUS
  Filled 2020-03-19: qty 2

## 2020-03-19 MED ORDER — MORPHINE SULFATE (PF) 2 MG/ML IV SOLN
2.0000 mg | INTRAVENOUS | Status: DC | PRN
  Administered 2020-03-19 – 2020-03-20 (×3): 2 mg via INTRAVENOUS
  Filled 2020-03-19 (×3): qty 1

## 2020-03-19 MED ORDER — TETANUS-DIPHTH-ACELL PERTUSSIS 5-2.5-18.5 LF-MCG/0.5 IM SUSP
0.5000 mL | Freq: Once | INTRAMUSCULAR | Status: AC
  Administered 2020-03-19: 0.5 mL via INTRAMUSCULAR

## 2020-03-19 MED ORDER — OXYCODONE HCL 5 MG PO TABS
5.0000 mg | ORAL_TABLET | ORAL | Status: DC | PRN
  Administered 2020-03-19 – 2020-03-20 (×3): 5 mg via ORAL
  Filled 2020-03-19 (×3): qty 1

## 2020-03-19 MED ORDER — IOHEXOL 300 MG/ML  SOLN
100.0000 mL | Freq: Once | INTRAMUSCULAR | Status: AC | PRN
  Administered 2020-03-19: 100 mL via INTRAVENOUS

## 2020-03-19 MED ORDER — METOPROLOL TARTRATE 5 MG/5ML IV SOLN: 5.0000 mg | Freq: Four times a day (QID) | INTRAVENOUS | Status: DC | PRN

## 2020-03-19 MED ORDER — ONDANSETRON HCL 4 MG/2ML IJ SOLN: 4.0000 mg | Freq: Four times a day (QID) | INTRAMUSCULAR | Status: DC | PRN

## 2020-03-19 MED ORDER — CEFAZOLIN SODIUM-DEXTROSE 2-4 GM/100ML-% IV SOLN
2.0000 g | Freq: Once | INTRAVENOUS | Status: AC
  Administered 2020-03-19: 2 g via INTRAVENOUS
  Filled 2020-03-19: qty 100

## 2020-03-19 MED ORDER — ACETAMINOPHEN 325 MG PO TABS
650.0000 mg | ORAL_TABLET | ORAL | Status: DC | PRN
  Administered 2020-03-20: 650 mg via ORAL
  Filled 2020-03-19: qty 2

## 2020-03-19 MED ORDER — ONDANSETRON 4 MG PO TBDP
4.0000 mg | ORAL_TABLET | Freq: Four times a day (QID) | ORAL | Status: DC | PRN
  Administered 2020-03-20 (×2): 4 mg via ORAL
  Filled 2020-03-19 (×2): qty 1

## 2020-03-19 MED ORDER — SODIUM CHLORIDE 0.9 % IV SOLN: INTRAVENOUS | Status: DC
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
## 2020-03-19 NOTE — ED Notes (Signed)
Ortho at bedside.
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
## 2020-03-19 NOTE — ED Notes (Signed)
Dinner ordered 
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
## 2020-03-20 LAB — CBC
HCT: 39.2 % (ref 39.0–52.0)
Hemoglobin: 13.4 g/dL (ref 13.0–17.0)
MCH: 31.8 pg (ref 26.0–34.0)
MCHC: 34.2 g/dL (ref 30.0–36.0)
MCV: 92.9 fL (ref 80.0–100.0)
Platelets: 198 10*3/uL (ref 150–400)
RBC: 4.22 MIL/uL (ref 4.22–5.81)
RDW: 13.6 % (ref 11.5–15.5)
WBC: 14.6 10*3/uL — ABNORMAL HIGH (ref 4.0–10.5)
nRBC: 0 % (ref 0.0–0.2)

## 2020-03-20 LAB — BASIC METABOLIC PANEL
Anion gap: 15 (ref 5–15)
BUN: 10 mg/dL (ref 6–20)
CO2: 23 mmol/L (ref 22–32)
Calcium: 8.9 mg/dL (ref 8.9–10.3)
Chloride: 100 mmol/L (ref 98–111)
Creatinine, Ser: 0.9 mg/dL (ref 0.61–1.24)
GFR calc Af Amer: >60 mL/min (ref >60)
GFR calc non Af Amer: >60 mL/min (ref >60)
Glucose, Bld: 118 mg/dL — ABNORMAL HIGH (ref 70–99)
Potassium: 4 mmol/L (ref 3.5–5.1)
Sodium: 138 mmol/L (ref 135–145)

## 2020-03-20 MED ORDER — ACETAMINOPHEN 325 MG PO TABS: 650.0000 mg | ORAL_TABLET | ORAL | Status: DC | PRN

## 2020-03-20 MED ORDER — OXYCODONE HCL 5 MG PO TABS: 5.0000 mg | ORAL_TABLET | Freq: Four times a day (QID) | ORAL | 0 refills | Status: DC | PRN

## 2020-03-20 MED ORDER — IBUPROFEN 200 MG PO TABS: 400.0000 mg | ORAL_TABLET | Freq: Four times a day (QID) | ORAL | 2 refills | Status: AC | PRN
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
## 2020-03-20 NOTE — Progress Notes (Signed)
Orthopedic Tech Progress Note Patient Details:  Joseph Bautista 1986-03-26 [id number]  Ortho Devices Type of Ortho Device: Sugartong splint Ortho Device/Splint Location: RUE Ortho Device/Splint Interventions: Application   Post Interventions Patient Tolerated: Well Instructions Provided: Care of device   [name] 03/20/2020, 6:19 AM
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
## 2020-03-20 NOTE — Plan of Care (Signed)

## 2020-03-20 NOTE — Plan of Care (Signed)
  Problem: Pain Managment: Goal: General experience of comfort will improve Outcome: Progressing   
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
## 2020-03-20 NOTE — Social Work (Signed)
CSW was unable to complete sbirt. Pt called into pt room 3x with no answer. CSW will try attempt to try again.   Jimmy Picket, Theresia Majors, Bridget Hartshorn Licensed Clinical Social Worker [phone]

## 2020-03-21 ENCOUNTER — Encounter: Payer: Self-pay | Admitting: Physical Therapy

## 2020-03-26 ENCOUNTER — Other Ambulatory Visit: Payer: Self-pay

## 2020-03-26 ENCOUNTER — Encounter (HOSPITAL_COMMUNITY): Payer: Self-pay

## 2020-03-26 DIAGNOSIS — R2241 Localized swelling, mass and lump, right lower limb: Secondary | ICD-10-CM | POA: Insufficient documentation

## 2020-03-26 DIAGNOSIS — Z87891 Personal history of nicotine dependence: Secondary | ICD-10-CM | POA: Diagnosis not present

## 2020-03-26 DIAGNOSIS — I10 Essential (primary) hypertension: Secondary | ICD-10-CM | POA: Diagnosis not present

## 2020-03-26 DIAGNOSIS — Z79899 Other long term (current) drug therapy: Secondary | ICD-10-CM | POA: Insufficient documentation

## 2020-03-26 NOTE — ED Notes (Signed)
Pt able to assist himself out of the car and into a wheelchair to be assisted into the lobby.

## 2020-03-26 NOTE — ED Notes (Signed)
Patients feet propped up in lobby for comfort
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
## 2020-03-27 ENCOUNTER — Emergency Department (HOSPITAL_COMMUNITY)
Admission: EM | Admit: 2020-03-27 | Discharge: 2020-03-27 | Disposition: A | Discharge status: Home / Self Care | Payer: Medicaid Other | Attending: Emergency Medicine | Admitting: Emergency Medicine

## 2020-03-27 ENCOUNTER — Encounter (HOSPITAL_COMMUNITY): Payer: Medicaid Other

## 2020-03-27 DIAGNOSIS — R600 Localized edema: Secondary | ICD-10-CM

## 2020-03-27 MED ORDER — HYDROCODONE-ACETAMINOPHEN 5-325 MG PO TABS
2.0000 | ORAL_TABLET | Freq: Once | ORAL | Status: AC
  Administered 2020-03-27: 2 via ORAL
  Filled 2020-03-27: qty 2

## 2020-03-27 MED ORDER — HYDROCODONE-ACETAMINOPHEN 5-325 MG PO TABS: 1.0000 | ORAL_TABLET | Freq: Four times a day (QID) | ORAL | 0 refills | Status: DC | PRN

## 2020-03-27 MED ORDER — ENOXAPARIN SODIUM 120 MG/0.8ML ~~LOC~~ SOLN
1.0000 mg/kg | Freq: Once | SUBCUTANEOUS | Status: AC
  Administered 2020-03-27: 105 mg via SUBCUTANEOUS
  Filled 2020-03-27: qty 0.7
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
## 2020-03-27 NOTE — Discharge Instructions (Signed)
Please return to [facility] around 11 AM to have an ultrasound of your right leg to rule out a blood clot The shot of Lovenox that we gave you will cover you for any potential blood clot Please follow-up with the bone specialist today.

## 2020-03-29 ENCOUNTER — Ambulatory Visit (HOSPITAL_COMMUNITY): Admission: RE | Admit: 2020-03-29 | Payer: Medicaid Other | Source: Ambulatory Visit

## 2020-04-02 ENCOUNTER — Encounter (HOSPITAL_COMMUNITY): Payer: Self-pay | Admitting: Emergency Medicine

## 2020-04-02 ENCOUNTER — Emergency Department (HOSPITAL_BASED_OUTPATIENT_CLINIC_OR_DEPARTMENT_OTHER): Payer: Medicaid Other

## 2020-04-02 ENCOUNTER — Emergency Department (HOSPITAL_COMMUNITY)
Admission: EM | Admit: 2020-04-02 | Discharge: 2020-04-02 | Disposition: A | Discharge status: Home / Self Care | Payer: Medicaid Other | Attending: Emergency Medicine | Admitting: Emergency Medicine

## 2020-04-02 DIAGNOSIS — R600 Localized edema: Secondary | ICD-10-CM | POA: Diagnosis not present

## 2020-04-02 DIAGNOSIS — W3400XD Accidental discharge from unspecified firearms or gun, subsequent encounter: Secondary | ICD-10-CM | POA: Diagnosis not present

## 2020-04-02 DIAGNOSIS — W3400XA Accidental discharge from unspecified firearms or gun, initial encounter: Secondary | ICD-10-CM

## 2020-04-02 DIAGNOSIS — S70921A Unspecified superficial injury of right thigh, initial encounter: Secondary | ICD-10-CM | POA: Diagnosis not present

## 2020-04-02 DIAGNOSIS — Z5189 Encounter for other specified aftercare: Secondary | ICD-10-CM | POA: Diagnosis not present

## 2020-04-02 MED ORDER — OXYCODONE-ACETAMINOPHEN 5-325 MG PO TABS
1.0000 | ORAL_TABLET | Freq: Once | ORAL | Status: AC
  Administered 2020-04-02: 1 via ORAL
  Filled 2020-04-02: qty 1

## 2020-04-02 MED ORDER — OXYCODONE-ACETAMINOPHEN 5-325 MG PO TABS: 1.0000 | ORAL_TABLET | Freq: Four times a day (QID) | ORAL | 0 refills | Status: DC | PRN

## 2020-04-02 MED ORDER — NAPROXEN 500 MG PO TABS: 500.0000 mg | ORAL_TABLET | Freq: Two times a day (BID) | ORAL | 0 refills | Status: DC
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
## 2020-04-02 NOTE — Progress Notes (Signed)
Orthopedic Tech Progress Note Patient Details:  Joseph Bautista 10-13-1986 [id number]  Ortho Devices Type of Ortho Device: Ace wrap, Sugartong splint, Arm sling Ortho Device/Splint Location: RUE Ortho Device/Splint Interventions: Ordered, Application   Post Interventions Patient Tolerated: Well Instructions Provided: Care of device   Jennye Moccasin 04/02/2020, 12:07 PM

## 2020-04-02 NOTE — Progress Notes (Signed)
VASCULAR LAB PRELIMINARY  PRELIMINARY  PRELIMINARY  PRELIMINARY  Right lower extremity venous duplex completed.    Preliminary report:  See CV proc for preliminary results.   Gave report to Claudette Stapler, PA-C  Shiori Adcox, RVT 04/02/2020, 2:07 PM
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
## 2020-04-02 NOTE — ED Notes (Signed)
Ultrasound at bedside for PVL study

## 2020-04-11 DIAGNOSIS — M25531 Pain in right wrist: Secondary | ICD-10-CM | POA: Insufficient documentation

## 2020-04-12 DIAGNOSIS — S52209A Unspecified fracture of shaft of unspecified ulna, initial encounter for closed fracture: Secondary | ICD-10-CM | POA: Insufficient documentation

## 2020-04-25 ENCOUNTER — Other Ambulatory Visit: Payer: Self-pay | Admitting: Family Medicine

## 2020-04-25 DIAGNOSIS — I10 Essential (primary) hypertension: Secondary | ICD-10-CM

## 2020-04-30 ENCOUNTER — Encounter (HOSPITAL_COMMUNITY): Payer: Self-pay

## 2020-04-30 ENCOUNTER — Emergency Department (HOSPITAL_COMMUNITY): Payer: Medicaid Other

## 2020-04-30 ENCOUNTER — Other Ambulatory Visit: Payer: Self-pay

## 2020-04-30 ENCOUNTER — Emergency Department (HOSPITAL_COMMUNITY)
Admission: EM | Admit: 2020-04-30 | Discharge: 2020-04-30 | Disposition: A | Discharge status: Home / Self Care | Payer: Medicaid Other | Attending: Emergency Medicine | Admitting: Emergency Medicine

## 2020-04-30 ENCOUNTER — Ambulatory Visit (HOSPITAL_COMMUNITY)
Admission: EM | Admit: 2020-04-30 | Discharge: 2020-04-30 | Disposition: A | Discharge status: Home / Self Care | Payer: Medicaid Other | Attending: Internal Medicine | Admitting: Internal Medicine

## 2020-04-30 DIAGNOSIS — I10 Essential (primary) hypertension: Secondary | ICD-10-CM | POA: Insufficient documentation

## 2020-04-30 DIAGNOSIS — Z79899 Other long term (current) drug therapy: Secondary | ICD-10-CM | POA: Diagnosis not present

## 2020-04-30 DIAGNOSIS — M25561 Pain in right knee: Secondary | ICD-10-CM | POA: Insufficient documentation

## 2020-04-30 DIAGNOSIS — Z87891 Personal history of nicotine dependence: Secondary | ICD-10-CM | POA: Diagnosis not present

## 2020-04-30 MED ORDER — NAPROXEN 500 MG PO TABS: 500.0000 mg | ORAL_TABLET | Freq: Two times a day (BID) | ORAL | 0 refills | Status: DC

## 2020-04-30 MED ORDER — NAPROXEN 250 MG PO TABS
500.0000 mg | ORAL_TABLET | Freq: Once | ORAL | Status: AC
  Administered 2020-04-30: 500 mg via ORAL
  Filled 2020-04-30: qty 2

## 2020-04-30 MED ORDER — METHOCARBAMOL 500 MG PO TABS: 500.0000 mg | ORAL_TABLET | Freq: Two times a day (BID) | ORAL | 0 refills | Status: DC
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
## 2020-05-07 ENCOUNTER — Other Ambulatory Visit: Payer: Self-pay

## 2020-05-07 ENCOUNTER — Encounter: Payer: Self-pay | Admitting: Family Medicine

## 2020-05-07 ENCOUNTER — Ambulatory Visit (INDEPENDENT_AMBULATORY_CARE_PROVIDER_SITE_OTHER): Payer: Medicaid Other | Admitting: Family Medicine

## 2020-05-07 VITALS — BP 139/84 | HR 68 | Temp 98.5°F | Ht 74.0 in | Wt 248.0 lb

## 2020-05-07 DIAGNOSIS — I10 Essential (primary) hypertension: Secondary | ICD-10-CM | POA: Diagnosis not present

## 2020-05-07 DIAGNOSIS — Z09 Encounter for follow-up examination after completed treatment for conditions other than malignant neoplasm: Secondary | ICD-10-CM

## 2020-05-07 DIAGNOSIS — S41131S Puncture wound without foreign body of right upper arm, sequela: Secondary | ICD-10-CM

## 2020-05-07 DIAGNOSIS — W3400XS Accidental discharge from unspecified firearms or gun, sequela: Secondary | ICD-10-CM

## 2020-05-07 DIAGNOSIS — W3400XA Accidental discharge from unspecified firearms or gun, initial encounter: Secondary | ICD-10-CM | POA: Diagnosis not present

## 2020-05-07 DIAGNOSIS — Y249XXA Unspecified firearm discharge, undetermined intent, initial encounter: Secondary | ICD-10-CM

## 2020-05-07 DIAGNOSIS — F419 Anxiety disorder, unspecified: Secondary | ICD-10-CM

## 2020-05-07 DIAGNOSIS — M79601 Pain in right arm: Secondary | ICD-10-CM

## 2020-05-07 LAB — POCT URINALYSIS DIPSTICK
Bilirubin, UA: NEGATIVE
Blood, UA: NEGATIVE
Glucose, UA: NEGATIVE
Ketones, UA: NEGATIVE
Leukocytes, UA: NEGATIVE
Nitrite, UA: NEGATIVE
Protein, UA: NEGATIVE
Spec Grav, UA: >=1.03 — AB (ref 1.010–1.025)
Urobilinogen, UA: 0.2 E.U./dL
pH, UA: 5.5 (ref 5.0–8.0)
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
## 2020-05-08 ENCOUNTER — Encounter: Payer: Self-pay | Admitting: Family Medicine

## 2020-07-15 DIAGNOSIS — B009 Herpesviral infection, unspecified: Secondary | ICD-10-CM

## 2020-07-15 DIAGNOSIS — E7800 Pure hypercholesterolemia, unspecified: Secondary | ICD-10-CM

## 2020-07-15 DIAGNOSIS — R7989 Other specified abnormal findings of blood chemistry: Secondary | ICD-10-CM

## 2020-07-15 DIAGNOSIS — E559 Vitamin D deficiency, unspecified: Secondary | ICD-10-CM

## 2020-07-15 HISTORY — DX: Vitamin D deficiency, unspecified: E55.9

## 2020-07-15 HISTORY — DX: Pure hypercholesterolemia, unspecified: E78.00

## 2020-07-15 HISTORY — DX: Herpesviral infection, unspecified: B00.9

## 2020-07-15 HISTORY — DX: Other specified abnormal findings of blood chemistry: R79.89

## 2020-07-21 ENCOUNTER — Other Ambulatory Visit: Payer: Self-pay | Admitting: Family Medicine

## 2020-08-07 ENCOUNTER — Encounter: Payer: Self-pay | Admitting: Family Medicine

## 2020-08-07 ENCOUNTER — Other Ambulatory Visit: Payer: Self-pay

## 2020-08-07 ENCOUNTER — Ambulatory Visit (INDEPENDENT_AMBULATORY_CARE_PROVIDER_SITE_OTHER): Payer: Medicaid Other | Admitting: Family Medicine

## 2020-08-07 VITALS — BP 125/74 | HR 65 | Temp 98.7°F | Resp 16 | Ht 74.0 in | Wt 238.4 lb

## 2020-08-07 DIAGNOSIS — G4700 Insomnia, unspecified: Secondary | ICD-10-CM

## 2020-08-07 DIAGNOSIS — I10 Essential (primary) hypertension: Secondary | ICD-10-CM

## 2020-08-07 DIAGNOSIS — G8929 Other chronic pain: Secondary | ICD-10-CM

## 2020-08-07 DIAGNOSIS — S41131S Puncture wound without foreign body of right upper arm, sequela: Secondary | ICD-10-CM

## 2020-08-07 DIAGNOSIS — M549 Dorsalgia, unspecified: Secondary | ICD-10-CM | POA: Diagnosis not present

## 2020-08-07 DIAGNOSIS — Z09 Encounter for follow-up examination after completed treatment for conditions other than malignant neoplasm: Secondary | ICD-10-CM

## 2020-08-07 DIAGNOSIS — F419 Anxiety disorder, unspecified: Secondary | ICD-10-CM

## 2020-08-07 DIAGNOSIS — W3400XS Accidental discharge from unspecified firearms or gun, sequela: Secondary | ICD-10-CM

## 2020-08-07 DIAGNOSIS — Z202 Contact with and (suspected) exposure to infections with a predominantly sexual mode of transmission: Secondary | ICD-10-CM

## 2020-08-07 LAB — POCT URINALYSIS DIPSTICK
Bilirubin, UA: NEGATIVE
Blood, UA: NEGATIVE
Glucose, UA: NEGATIVE
Ketones, UA: NEGATIVE
Leukocytes, UA: NEGATIVE
Nitrite, UA: NEGATIVE
Protein, UA: NEGATIVE
Spec Grav, UA: >=1.03 — AB (ref 1.010–1.025)
Urobilinogen, UA: 0.2 E.U./dL
pH, UA: 5.5 (ref 5.0–8.0)
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
## 2020-08-08 LAB — COMPREHENSIVE METABOLIC PANEL
ALT: 40 IU/L (ref 0–44)
AST: 25 IU/L (ref 0–40)
Albumin/Globulin Ratio: 2.1 (ref 1.2–2.2)
Albumin: 5 g/dL (ref 4.0–5.0)
Alkaline Phosphatase: 123 IU/L — ABNORMAL HIGH (ref 48–121)
BUN/Creatinine Ratio: 15 (ref 9–20)
BUN: 16 mg/dL (ref 6–20)
Bilirubin Total: 0.4 mg/dL (ref 0.0–1.2)
CO2: 24 mmol/L (ref 20–29)
Calcium: 9.8 mg/dL (ref 8.7–10.2)
Chloride: 100 mmol/L (ref 96–106)
Creatinine, Ser: 1.04 mg/dL (ref 0.76–1.27)
GFR calc Af Amer: 107 mL/min/{1.73_m2} (ref >59)
GFR calc non Af Amer: 93 mL/min/{1.73_m2} (ref >59)
Globulin, Total: 2.4 g/dL (ref 1.5–4.5)
Glucose: 89 mg/dL (ref 65–99)
Potassium: 4.4 mmol/L (ref 3.5–5.2)
Sodium: 140 mmol/L (ref 134–144)
Total Protein: 7.4 g/dL (ref 6.0–8.5)

## 2020-08-08 LAB — LIPID PANEL
Chol/HDL Ratio: 4.1 ratio (ref 0.0–5.0)
Cholesterol, Total: 190 mg/dL (ref 100–199)
HDL: 46 mg/dL (ref >39)
LDL Chol Calc (NIH): 123 mg/dL — ABNORMAL HIGH (ref 0–99)
Triglycerides: 115 mg/dL (ref 0–149)
VLDL Cholesterol Cal: 21 mg/dL (ref 5–40)

## 2020-08-08 LAB — STD SCREEN (8)
HIV Screen 4th Generation wRfx: NONREACTIVE
HSV 1 Glycoprotein G Ab, IgG: <0.91 index (ref 0.00–0.90)
HSV 2 IgG, Type Spec: 11.3 index — ABNORMAL HIGH (ref 0.00–0.90)
Hep A IgM: NEGATIVE
Hep B C IgM: NEGATIVE
Hep C Virus Ab: 0.1 s/co ratio (ref 0.0–0.9)
Hepatitis B Surface Ag: NEGATIVE
RPR Ser Ql: NONREACTIVE

## 2020-08-08 LAB — CBC WITH DIFFERENTIAL/PLATELET
Basophils Absolute: 0 10*3/uL (ref 0.0–0.2)
Basos: 0 %
EOS (ABSOLUTE): 0.1 10*3/uL (ref 0.0–0.4)
Eos: 2 %
Hematocrit: 48.1 % (ref 37.5–51.0)
Hemoglobin: 16.3 g/dL (ref 13.0–17.7)
Immature Grans (Abs): 0 10*3/uL (ref 0.0–0.1)
Immature Granulocytes: 0 %
Lymphocytes Absolute: 2.1 10*3/uL (ref 0.7–3.1)
Lymphs: 29 %
MCH: 31 pg (ref 26.6–33.0)
MCHC: 33.9 g/dL (ref 31.5–35.7)
MCV: 91 fL (ref 79–97)
Monocytes Absolute: 0.3 10*3/uL (ref 0.1–0.9)
Monocytes: 5 %
Neutrophils Absolute: 4.6 10*3/uL (ref 1.4–7.0)
Neutrophils: 64 %
Platelets: 209 10*3/uL (ref 150–450)
RBC: 5.26 x10E6/uL (ref 4.14–5.80)
RDW: 13.1 % (ref 11.6–15.4)
WBC: 7.2 10*3/uL (ref 3.4–10.8)

## 2020-08-08 LAB — TSH: TSH: 0.4 u[IU]/mL — ABNORMAL LOW (ref 0.450–4.500)

## 2020-08-08 LAB — VITAMIN D 25 HYDROXY (VIT D DEFICIENCY, FRACTURES): Vit D, 25-Hydroxy: 24.6 ng/mL — ABNORMAL LOW (ref 30.0–100.0)

## 2020-08-08 LAB — VITAMIN B12: Vitamin B-12: 438 pg/mL (ref 232–1245)

## 2020-08-09 LAB — CHLAMYDIA/GONOCOCCUS/TRICHOMONAS, NAA
Chlamydia by NAA: NEGATIVE
Gonococcus by NAA: NEGATIVE
Trich vag by NAA: NEGATIVE

## 2020-08-10 ENCOUNTER — Other Ambulatory Visit: Payer: Self-pay | Admitting: Family Medicine

## 2020-08-10 ENCOUNTER — Encounter: Payer: Self-pay | Admitting: Family Medicine

## 2020-08-10 DIAGNOSIS — R7989 Other specified abnormal findings of blood chemistry: Secondary | ICD-10-CM

## 2020-08-13 NOTE — Progress Notes (Signed)
Pt was called @ 8:16 am to go over labs. Pt stated he understood his labs and will keep his f/u appt.

## 2021-02-06 ENCOUNTER — Ambulatory Visit: Payer: Medicaid Other | Admitting: Family Medicine

## 2021-03-30 ENCOUNTER — Ambulatory Visit (INDEPENDENT_AMBULATORY_CARE_PROVIDER_SITE_OTHER): Payer: Medicaid Other

## 2021-03-30 ENCOUNTER — Encounter: Payer: Self-pay | Admitting: Emergency Medicine

## 2021-03-30 ENCOUNTER — Ambulatory Visit
Admission: EM | Admit: 2021-03-30 | Discharge: 2021-03-30 | Disposition: A | Discharge status: Home / Self Care | Payer: Medicaid Other | Attending: Emergency Medicine | Admitting: Emergency Medicine

## 2021-03-30 ENCOUNTER — Other Ambulatory Visit: Payer: Self-pay

## 2021-03-30 DIAGNOSIS — S63501A Unspecified sprain of right wrist, initial encounter: Secondary | ICD-10-CM | POA: Diagnosis not present

## 2021-03-30 DIAGNOSIS — S46811A Strain of other muscles, fascia and tendons at shoulder and upper arm level, right arm, initial encounter: Secondary | ICD-10-CM | POA: Diagnosis not present

## 2021-03-30 DIAGNOSIS — M25531 Pain in right wrist: Secondary | ICD-10-CM

## 2021-03-30 DIAGNOSIS — M25511 Pain in right shoulder: Secondary | ICD-10-CM | POA: Diagnosis not present

## 2021-03-30 MED ORDER — TIZANIDINE HCL 4 MG PO TABS: 2.0000 mg | ORAL_TABLET | Freq: Four times a day (QID) | ORAL | 0 refills | Status: DC | PRN

## 2021-03-30 MED ORDER — IBUPROFEN 800 MG PO TABS: 800.0000 mg | ORAL_TABLET | Freq: Three times a day (TID) | ORAL | 0 refills | Status: DC
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
## 2021-03-30 NOTE — Discharge Instructions (Signed)
Use anti-inflammatories for pain/swelling. You may take up to 800 mg Ibuprofen every 8 hours with food. You may supplement Ibuprofen with Tylenol [phone] mg every 8 hours.  Supplement tizanidine at home-may cause drowsiness, this is a muscle relaxer, do not drive or work after taking Alternate ice and heat to shoulder, ice rest Wrist brace for support Follow-up if not improving or worsening

## 2021-04-19 ENCOUNTER — Encounter: Payer: Self-pay | Admitting: Nurse Practitioner

## 2021-04-19 ENCOUNTER — Ambulatory Visit (INDEPENDENT_AMBULATORY_CARE_PROVIDER_SITE_OTHER): Payer: Medicaid Other | Admitting: Nurse Practitioner

## 2021-04-19 ENCOUNTER — Other Ambulatory Visit: Payer: Self-pay

## 2021-04-19 VITALS — BP 132/83 | HR 84 | Temp 97.2°F | Ht 74.0 in | Wt 232.3 lb

## 2021-04-19 DIAGNOSIS — I10 Essential (primary) hypertension: Secondary | ICD-10-CM

## 2021-04-19 DIAGNOSIS — R5383 Other fatigue: Secondary | ICD-10-CM | POA: Diagnosis not present

## 2021-04-19 DIAGNOSIS — Z113 Encounter for screening for infections with a predominantly sexual mode of transmission: Secondary | ICD-10-CM | POA: Diagnosis not present
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
## 2021-04-20 ENCOUNTER — Other Ambulatory Visit: Payer: Self-pay | Admitting: Nurse Practitioner

## 2021-04-20 ENCOUNTER — Encounter: Payer: Self-pay | Admitting: Nurse Practitioner

## 2021-04-20 DIAGNOSIS — E291 Testicular hypofunction: Secondary | ICD-10-CM

## 2021-04-20 LAB — COMP. METABOLIC PANEL (12)
AST: 19 IU/L (ref 0–40)
Albumin/Globulin Ratio: 1.9 (ref 1.2–2.2)
Albumin: 5 g/dL (ref 4.0–5.0)
Alkaline Phosphatase: 121 IU/L (ref 44–121)
BUN/Creatinine Ratio: 15 (ref 9–20)
BUN: 16 mg/dL (ref 6–20)
Bilirubin Total: 0.6 mg/dL (ref 0.0–1.2)
Calcium: 9.7 mg/dL (ref 8.7–10.2)
Chloride: 100 mmol/L (ref 96–106)
Creatinine, Ser: 1.1 mg/dL (ref 0.76–1.27)
Globulin, Total: 2.7 g/dL (ref 1.5–4.5)
Glucose: 92 mg/dL (ref 65–99)
Potassium: 3.8 mmol/L (ref 3.5–5.2)
Sodium: 139 mmol/L (ref 134–144)
Total Protein: 7.7 g/dL (ref 6.0–8.5)
eGFR: 89 mL/min/{1.73_m2} (ref >59)

## 2021-04-20 LAB — TSH: TSH: 0.506 u[IU]/mL (ref 0.450–4.500)

## 2021-04-20 LAB — TESTOSTERONE: Testosterone: 197 ng/dL — ABNORMAL LOW (ref 264–916)

## 2021-04-20 MED ORDER — TESTOSTERONE 20.25 MG/ACT (1.62%) TD GEL: 1.0000 "application " | Freq: Every day | TRANSDERMAL | 5 refills | Status: DC

## 2021-04-20 NOTE — Progress Notes (Signed)
   [facility] [address] [place], [place]  [address] Phone:  [phone]   Fax:  [phone] 

## 2021-04-22 LAB — CHLAMYDIA/GONOCOCCUS/TRICHOMONAS, NAA
Chlamydia by NAA: NEGATIVE
Gonococcus by NAA: NEGATIVE
Trich vag by NAA: NEGATIVE

## 2021-04-23 ENCOUNTER — Telehealth: Payer: Self-pay

## 2021-04-23 NOTE — Telephone Encounter (Signed)
Pt said he missed a call from [facility] yesterday 04/22/21  Std results

## 2021-06-20 ENCOUNTER — Ambulatory Visit (INDEPENDENT_AMBULATORY_CARE_PROVIDER_SITE_OTHER): Payer: Medicaid Other | Admitting: Nurse Practitioner

## 2021-06-20 ENCOUNTER — Ambulatory Visit: Payer: Medicaid Other | Admitting: Nurse Practitioner

## 2021-06-20 ENCOUNTER — Encounter: Payer: Self-pay | Admitting: Nurse Practitioner

## 2021-06-20 ENCOUNTER — Other Ambulatory Visit: Payer: Self-pay

## 2021-06-20 VITALS — BP 155/92 | HR 57 | Temp 97.2°F | Ht 74.0 in | Wt 233.0 lb

## 2021-06-20 DIAGNOSIS — I10 Essential (primary) hypertension: Secondary | ICD-10-CM | POA: Diagnosis not present

## 2021-06-20 DIAGNOSIS — E291 Testicular hypofunction: Secondary | ICD-10-CM

## 2021-06-20 DIAGNOSIS — Z113 Encounter for screening for infections with a predominantly sexual mode of transmission: Secondary | ICD-10-CM | POA: Diagnosis not present
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
## 2021-06-20 NOTE — Patient Instructions (Addendum)
Managing Your Hypertension Hypertension, also called high blood pressure, is when the force of the blood pressing against the walls of the arteries is too strong. Arteries are blood vessels that carry blood from your heart throughout your body. Hypertension forces the heart to work harder to pump blood and may cause the arteries tobecome narrow or stiff. Understanding blood pressure readings Your personal target blood pressure may vary depending on your medical conditions, your age, and other factors. A blood pressure reading includes a higher number over a lower number. Ideally, your blood pressure should be below 120/80. You should know that: The first, or top, number is called the systolic pressure. It is a measure of the pressure in your arteries as your heart beats. The second, or bottom number, is called the diastolic pressure. It is a measure of the pressure in your arteries as the heart relaxes. Blood pressure is classified into four stages. Based on your blood pressure reading, your health care provider may use the following stages to determine what type of treatment you need, if any. Systolic pressure and diastolicpressure are measured in a unit called mmHg. Normal Systolic pressure: below 120. Diastolic pressure: below 80. Elevated Systolic pressure: 120-129. Diastolic pressure: below 80. Hypertension stage 1 Systolic pressure: 130-139. Diastolic pressure: 80-89. Hypertension stage 2 Systolic pressure: 140 or above. Diastolic pressure: 90 or above. How can this condition affect me? Managing your hypertension is an important responsibility. Over time, hypertension can damage the arteries and decrease blood flow to important parts of the body, including the brain, heart, and kidneys. Having untreated or uncontrolled hypertension can lead to: A heart attack. A stroke. A weakened blood vessel (aneurysm). Heart failure. Kidney damage. Eye damage. Metabolic syndrome. Memory and  concentration problems. Vascular dementia. What actions can I take to manage this condition? Hypertension can be managed by making lifestyle changes and possibly by taking medicines. Your health care provider will help you make a plan to bring yourblood pressure within a normal range. Nutrition  Eat a diet that is high in fiber and potassium, and low in salt (sodium), added sugar, and fat. An example eating plan is called the Dietary Approaches to Stop Hypertension (DASH) diet. To eat this way: Eat plenty of fresh fruits and vegetables. Try to fill one-half of your plate at each meal with fruits and vegetables. Eat whole grains, such as whole-wheat pasta, brown rice, or whole-grain bread. Fill about one-fourth of your plate with whole grains. Eat low-fat dairy products. Avoid fatty cuts of meat, processed or cured meats, and poultry with skin. Fill about one-fourth of your plate with lean proteins such as fish, chicken without skin, beans, eggs, and tofu. Avoid pre-made and processed foods. These tend to be higher in sodium, added sugar, and fat. Reduce your daily sodium intake. Most people with hypertension should eat less than 1,500 mg of sodium a day.  Lifestyle  Work with your health care provider to maintain a healthy body weight or to lose weight. Ask what an ideal weight is for you. Get at least 30 minutes of exercise that causes your heart to beat faster (aerobic exercise) most days of the week. Activities may include walking, swimming, or biking. Include exercise to strengthen your muscles (resistance exercise), such as weight lifting, as part of your weekly exercise routine. Try to do these types of exercises for 30 minutes at least 3 days a week. Do not use any products that contain nicotine or tobacco, such as cigarettes, e-cigarettes, and chewing   tobacco. If you need help quitting, ask your health care provider. Control any long-term (chronic) conditions you have, such as high  cholesterol or diabetes. Identify your sources of stress and find ways to manage stress. This may include meditation, deep breathing, or making time for fun activities.  Alcohol use Do not drink alcohol if: Your health care provider tells you not to drink. You are pregnant, may be pregnant, or are planning to become pregnant. If you drink alcohol: Limit how much you use to: 0-1 drink a day for women. 0-2 drinks a day for men. Be aware of how much alcohol is in your drink. In the [place] one drink equals one 12 oz bottle of beer (355 mL), one 5 oz glass of wine (148 mL), or one 1 oz glass of hard liquor (44 mL). Medicines Your health care provider may prescribe medicine if lifestyle changes are not enough to get your blood pressure under control and if: Your systolic blood pressure is 130 or higher. Your diastolic blood pressure is 80 or higher. Take medicines only as told by your health care provider. Follow the directions carefully. Blood pressure medicines must be taken as told by your health care provider. The medicine does not work as well when you skip doses. Skippingdoses also puts you at risk for problems. Monitoring Before you monitor your blood pressure: Do not smoke, drink caffeinated beverages, or exercise within 30 minutes before taking a measurement. Use the bathroom and empty your bladder (urinate). Sit quietly for at least 5 minutes before taking measurements. Monitor your blood pressure at home as told by your health care provider. To do this: Sit with your back straight and supported. Place your feet flat on the floor. Do not cross your legs. Support your arm on a flat surface, such as a table. Make sure your upper arm is at heart level. Each time you measure, take two or three readings one minute apart and record the results. You may also need to have your blood pressure checked regularly by your healthcare provider. General information Talk with your health care  provider about your diet, exercise habits, and other lifestyle factors that may be contributing to hypertension. Review all the medicines you take with your health care provider because there may be side effects or interactions. Keep all visits as told by your health care provider. Your health care provider can help you create and adjust your plan for managing your high blood pressure. Where to find more information National Heart, Lung, and Blood Institute: www.nhlbi.nih.gov American Heart Association: www.heart.org Contact a health care provider if: You think you are having a reaction to medicines you have taken. You have repeated (recurrent) headaches. You feel dizzy. You have swelling in your ankles. You have trouble with your vision. Get help right away if: You develop a severe headache or confusion. You have unusual weakness or numbness, or you feel faint. You have severe pain in your chest or abdomen. You vomit repeatedly. You have trouble breathing. These symptoms may represent a serious problem that is an emergency. Do not wait to see if the symptoms will go away. Get medical help right away. Call your local emergency services (911 in the [place] Do not drive yourself to the hospital. Summary Hypertension is when the force of blood pumping through your arteries is too strong. If this condition is not controlled, it may put you at risk for serious complications. Your personal target blood pressure may vary depending on your medical conditions,   your age, and other factors. For most people, a normal blood pressure is less than 120/80. Hypertension is managed by lifestyle changes, medicines, or both. Lifestyle changes to help manage hypertension include losing weight, eating a healthy, low-sodium diet, exercising more, stopping smoking, and limiting alcohol. This information is not intended to replace advice given to you by your health care provider. Make sure you discuss any questions  you have with your healthcare provider. Document Revised: 01/06/2020 Document Reviewed: 11/01/2019 Elsevier Patient Education  2022 Elsevier Inc.  Testosterone Skin Gel What is this medication? TESTOSTERONE (tes TOS ter one) is used to increase testosterone levels in yourbody. It belongs to a group of medications called androgen hormones. This medicine may be used for other purposes; ask your health care provider orpharmacist if you have questions. COMMON BRAND NAME(S): AndroGel, FORTESTA, Testim, Vogelxo What should I tell my care team before I take this medication? They need to know if you have any of these conditions: Breast cancer Diabetes Heart disease Heart failure If a male partner is pregnant or trying to get pregnant Kidney disease Liver disease Lung or breathing disease (asthma, COPD) Polycythemia Prostate cancer or disease Sleep apnea An unusual or allergic reaction to testosterone, other medications, foods, dyes, or preservatives If a male partner is pregnant or trying to get pregnant Breast-feeding How should I use this medication? This medication is for external use only. Do not take by mouth. Wash your hands before and after use. Use it as directed on the prescription label, at the same time every day. Do not use it more often than directed. Make sure that you are using your product correctly. Allow the skin to air-dry, then cover with clothing to prevent others from coming in contact with the medication on yourskin. This medication comes with INSTRUCTIONS FOR USE. Ask your pharmacist for directions on how to use this medication. Read the information carefully. Talkto your pharmacist or care team if you have questions. A special MedGuide will be given to you by the pharmacist with eachprescription and refill. Be sure to read this information carefully each time. Talk to your care team regarding the use of this medication in children.Special care may be  needed. Overdosage: If you think you have taken too much of this medicine contact apoison control center or emergency room at once. NOTE: This medicine is only for you. Do not share this medicine with others. What if I miss a dose? If you miss a dose, use it as soon as you can. If it is almost time for yournext dose, use only that dose. Do not use double or extra doses. What may interact with this medication? Medications for diabetes Medications that treat or prevent blood clots like warfarin Steroid medications like prednisone or cortisone This list may not describe all possible interactions. Give your health care provider a list of all the medicines, herbs, non-prescription drugs, or dietary supplements you use. Also tell them if you smoke, drink alcohol, or use illegaldrugs. Some items may interact with your medicine. What should I watch for while using this medication? Visit your care team for regular checks on your progress. Tell your care team if your symptoms do not start to get better or if they get worse.You may needblood work while you are taking this medication. This medication is for use in men who have low levels of testosterone related to certain medical conditions. Heart attacks and strokes have been reported with the use of this medication. Get emergency help if  you develop signs or symptoms of a heart attack or stroke. Talk to your care team about the risksand benefits of this medication. This medication can transfer from your body to others. If a person or pet comes in contact with the medication, they may have a serious risk of side effects. If you cannot avoid skin-to-skin contact, cover the medication site with clothing. If accidental contact happens, wash the skin of the person or pet right away with soap and water. Also, a male partner who is pregnant ortrying to get pregnant should avoid contact with the gel or treated skin. Do not become pregnant while taking this medication.  Women should inform their care team if they wish to become pregnant or think they might be pregnant. There is potential for serious harm to an unborn child. Tell your care team formore information. This medication may affect blood sugar. Ask your care team if changes in dietor medications are needed if you have diabetes. This medication is banned from use in athletes by most athletic organizations. What side effects may I notice from receiving this medication? Side effects that you should report to your care team as soon as possible: Allergic reactions-skin rash, itching, hives, swelling of the face, lips, tongue, or throat Blood clot-pain, swelling, or warmth in the leg, shortness of breath, chest pain Heart attack-pain or tightness in the chest, shoulders, arms, or jaw, nausea, shortness of breath, cold or clammy skin, feeling faint or lightheaded Increase in blood pressure Liver injury-right upper belly pain, loss of appetite, nausea, light-colored stool, dark yellow or brown urine, yellowing of the skin or eyes, unusual weakness or fatigue Mood swings, irritability or hostility Prolonged or painful erection Sleep apnea-loud snoring, gasping during sleep, daytime sleepiness Stroke-sudden numbness or weakness of the face, arm, or leg, trouble speaking, confusion, trouble walking, loss of balance or coordination, dizziness, severe headache, change in vision Swelling of the ankles, hands, or feet Thoughts of suicide or self-harm, worsening mood, feelings of depression Side effects that usually do not require medical attention (report these toyour care team if they continue or are bothersome): Acne Change in sex drive or performance Irritation at application site Unexpected breast tissue growth This list may not describe all possible side effects. Call your doctor for medical advice about side effects. You may report side effects to FDA at1-800-FDA-1088. Where should I keep my medication? Keep  out of the reach of children and pets. This medication can be abused. Keep your medication in a safe place to protect it from theft. Do not share this medication with anyone. Selling or giving away this medication is dangerous andagainst the law. Store at room temperature between 20 and 25 degrees C (68 to 77 degrees F). Keep closed until use. Protect from heat and light. This medication is flammable. Avoid exposure to heat, fire, flame, and smoking. Throw away anyunused medication after the expiration date. NOTE: This sheet is a summary. It may not cover all possible information. If you have questions about this medicine, talk to your doctor, pharmacist, orhealth care provider.  2022 Elsevier/Gold Standard (2021-02-12 14:12:24)

## 2021-06-22 LAB — CHLAMYDIA/GONOCOCCUS/TRICHOMONAS, NAA
Chlamydia by NAA: NEGATIVE
Gonococcus by NAA: NEGATIVE
Trich vag by NAA: NEGATIVE

## 2021-06-25 ENCOUNTER — Telehealth: Payer: Self-pay

## 2021-06-25 NOTE — Telephone Encounter (Signed)
-----   Message from Barbette Merino, NP sent at 06/25/2021  7:22 AM EDT ----- Labs negative thanks

## 2021-06-25 NOTE — Telephone Encounter (Signed)
Left voicemail advised to call back with any questions or concerns.  

## 2021-08-22 ENCOUNTER — Ambulatory Visit (INDEPENDENT_AMBULATORY_CARE_PROVIDER_SITE_OTHER): Payer: Medicaid Other | Admitting: Nurse Practitioner

## 2021-08-22 ENCOUNTER — Encounter: Payer: Self-pay | Admitting: Nurse Practitioner

## 2021-08-22 ENCOUNTER — Other Ambulatory Visit: Payer: Self-pay

## 2021-08-22 VITALS — BP 134/90 | HR 82 | Temp 98.1°F | Ht 74.0 in | Wt 229.0 lb

## 2021-08-22 DIAGNOSIS — I10 Essential (primary) hypertension: Secondary | ICD-10-CM

## 2021-08-22 DIAGNOSIS — E291 Testicular hypofunction: Secondary | ICD-10-CM | POA: Diagnosis not present

## 2021-08-22 LAB — POCT URINALYSIS DIP (CLINITEK)
Bilirubin, UA: NEGATIVE
Blood, UA: NEGATIVE
Glucose, UA: NEGATIVE mg/dL
Ketones, POC UA: NEGATIVE mg/dL
Leukocytes, UA: NEGATIVE
Nitrite, UA: NEGATIVE
POC PROTEIN,UA: NEGATIVE
Spec Grav, UA: >=1.03 — AB (ref 1.010–1.025)
Urobilinogen, UA: 0.2 E.U./dL
pH, UA: 6 (ref 5.0–8.0)

## 2021-08-22 MED ORDER — AMLODIPINE BESYLATE 5 MG PO TABS: 5.0000 mg | ORAL_TABLET | Freq: Every day | ORAL | 3 refills | Status: DC
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
## 2021-08-22 NOTE — Patient Instructions (Addendum)
Calorie Counting for Weight Loss Calories are units of energy. Your body needs a certain number of calories from food to keep going throughout the day. When you eat or drink more calories than your body needs, your body stores the extra calories mostly as fat. When you eat or drink fewer calories than your body needs, your body burns fat to get the energy it needs. Calorie counting means keeping track of how many calories you eat and drink each day. Calorie counting can be helpful if you need to lose weight. If you eat fewer calories than your body needs, you should lose weight. Ask your health care provider what a healthy weight is for you. For calorie counting to work, you will need to eat the right number of calories each day to lose a healthy amount of weight per week. A dietitian can help you figure out how many calories you need in a day and will suggest ways to reach your calorie goal. A healthy amount of weight to lose each week is usually 1-2 lb (0.5-0.9 kg). This usually means that your daily calorie intake should be reduced by 500-750 calories. Eating 1,200-1,500 calories a day can help most women lose weight. Eating 1,500-1,800 calories a day can help most men lose weight. What do I need to know about calorie counting? Work with your health care provider or dietitian to determine how many calories you should get each day. To meet your daily calorie goal, you will need to: Find out how many calories are in each food that you would like to eat. Try to do this before you eat. Decide how much of the food you plan to eat. Keep a food log. Do this by writing down what you ate and how many calories it had. To successfully lose weight, it is important to balance calorie counting with a healthy lifestyle that includes regular activity. Where do I find calorie information? The number of calories in a food can be found on a Nutrition Facts label. If a food does not have a Nutrition Facts label, try to  look up the calories online or ask your dietitian for help. Remember that calories are listed per serving. If you choose to have more than one serving of a food, you will have to multiply the calories per serving by the number of servings you plan to eat. For example, the label on a package of bread might say that a serving size is 1 slice and that there are 90 calories in a serving. If you eat 1 slice, you will have eaten 90 calories. If you eat 2 slices, you will have eaten 180 calories. How do I keep a food log? After each time that you eat, record the following in your food log as soon as possible: What you ate. Be sure to include toppings, sauces, and other extras on the food. How much you ate. This can be measured in cups, ounces, or number of items. How many calories were in each food and drink. The total number of calories in the food you ate. Keep your food log near you, such as in a pocket-sized notebook or on an app or website on your mobile phone. Some programs will calculate calories for you and show you how many calories you have left to meet your daily goal. What are some portion-control tips? Know how many calories are in a serving. This will help you know how many servings you can have of a certain food.  Use a measuring cup to measure serving sizes. You could also try weighing out portions on a kitchen scale. With time, you will be able to estimate serving sizes for some foods. Take time to put servings of different foods on your favorite plates or in your favorite bowls and cups so you know what a serving looks like. Try not to eat straight from a food's packaging, such as from a bag or box. Eating straight from the package makes it hard to see how much you are eating and can lead to overeating. Put the amount you would like to eat in a cup or on a plate to make sure you are eating the right portion. Use smaller plates, glasses, and bowls for smaller portions and to prevent  overeating. Try not to multitask. For example, avoid watching TV or using your computer while eating. If it is time to eat, sit down at a table and enjoy your food. This will help you recognize when you are full. It will also help you be more mindful of what and how much you are eating. What are tips for following this plan? Reading food labels Check the calorie count compared with the serving size. The serving size may be smaller than what you are used to eating. Check the source of the calories. Try to choose foods that are high in protein, fiber, and vitamins, and low in saturated fat, trans fat, and sodium. Shopping Read nutrition labels while you shop. This will help you make healthy decisions about which foods to buy. Pay attention to nutrition labels for low-fat or fat-free foods. These foods sometimes have the same number of calories or more calories than the full-fat versions. They also often have added sugar, starch, or salt to make up for flavor that was removed with the fat. Make a grocery list of lower-calorie foods and stick to it. Cooking Try to cook your favorite foods in a healthier way. For example, try baking instead of frying. Use low-fat dairy products. Meal planning Use more fruits and vegetables. One-half of your plate should be fruits and vegetables. Include lean proteins, such as chicken, [place], and fish. Lifestyle Each week, aim to do one of the following: 150 minutes of moderate exercise, such as walking. 75 minutes of vigorous exercise, such as running. General information Know how many calories are in the foods you eat most often. This will help you calculate calorie counts faster. Find a way of tracking calories that works for you. Get creative. Try different apps or programs if writing down calories does not work for you. What foods should I eat?  Eat nutritious foods. It is better to have a nutritious, high-calorie food, such as an avocado, than a food with  few nutrients, such as a bag of potato chips. Use your calories on foods and drinks that will fill you up and will not leave you hungry soon after eating. Examples of foods that fill you up are nuts and nut butters, vegetables, lean proteins, and high-fiber foods such as whole grains. High-fiber foods are foods with more than 5 g of fiber per serving. Pay attention to calories in drinks. Low-calorie drinks include water and unsweetened drinks. The items listed above may not be a complete list of foods and beverages you can eat. Contact a dietitian for more information. What foods should I limit? Limit foods or drinks that are not good sources of vitamins, minerals, or protein or that are high in unhealthy fats. These include:  Candy. Other sweets. Sodas, specialty coffee drinks, alcohol, and juice. The items listed above may not be a complete list of foods and beverages you should avoid. Contact a dietitian for more information. How do I count calories when eating out? Pay attention to portions. Often, portions are much larger when eating out. Try these tips to keep portions smaller: Consider sharing a meal instead of getting your own. If you get your own meal, eat only half of it. Before you start eating, ask for a container and put half of your meal into it. When available, consider ordering smaller portions from the menu instead of full portions. Pay attention to your food and drink choices. Knowing the way food is cooked and what is included with the meal can help you eat fewer calories. If calories are listed on the menu, choose the lower-calorie options. Choose dishes that include vegetables, fruits, whole grains, low-fat dairy products, and lean proteins. Choose items that are boiled, broiled, grilled, or steamed. Avoid items that are buttered, battered, fried, or served with cream sauce. Items labeled as crispy are usually fried, unless stated otherwise. Choose water, low-fat milk,  unsweetened iced tea, or other drinks without added sugar. If you want an alcoholic beverage, choose a lower-calorie option, such as a glass of wine or light beer. Ask for dressings, sauces, and syrups on the side. These are usually high in calories, so you should limit the amount you eat. If you want a salad, choose a garden salad and ask for grilled meats. Avoid extra toppings such as bacon, cheese, or fried items. Ask for the dressing on the side, or ask for olive oil and vinegar or lemon to use as dressing. Estimate how many servings of a food you are given. Knowing serving sizes will help you be aware of how much food you are eating at restaurants. Where to find more information Centers for Disease Control and Prevention: FootballExhibition.com.br [place] Department of Agriculture: WrestlingReporter.dk Summary Calorie counting means keeping track of how many calories you eat and drink each day. If you eat fewer calories than your body needs, you should lose weight. A healthy amount of weight to lose per week is usually 1-2 lb (0.5-0.9 kg). This usually means reducing your daily calorie intake by 500-750 calories. The number of calories in a food can be found on a Nutrition Facts label. If a food does not have a Nutrition Facts label, try to look up the calories online or ask your dietitian for help. Use smaller plates, glasses, and bowls for smaller portions and to prevent overeating. Use your calories on foods and drinks that will fill you up and not leave you hungry shortly after a meal. This information is not intended to replace advice given to you by your health care provider. Make sure you discuss any questions you have with your health care provider. Document Revised: 01/12/2020 Document Reviewed: 01/12/2020 Elsevier Patient Education  2022 [employer]  Managing Your Hypertension Hypertension, also called high blood pressure, is when the force of the blood pressing against the walls of the arteries is too  strong. Arteries are blood vessels that carry blood from your heart throughout your body. Hypertension forces the heart to work harder to pump blood and may cause the arteries to become narrow or stiff. Understanding blood pressure readings Your personal target blood pressure may vary depending on your medical conditions, your age, and other factors. A blood pressure reading includes a higher number over a lower number. Ideally,  your blood pressure should be below 120/80. You should know that: The first, or top, number is called the systolic pressure. It is a measure of the pressure in your arteries as your heart beats. The second, or bottom number, is called the diastolic pressure. It is a measure of the pressure in your arteries as the heart relaxes. Blood pressure is classified into four stages. Based on your blood pressure reading, your health care provider may use the following stages to determine what type of treatment you need, if any. Systolic pressure and diastolic pressure are measured in a unit called mmHg. Normal Systolic pressure: below 120. Diastolic pressure: below 80. Elevated Systolic pressure: 120-129. Diastolic pressure: below 80. Hypertension stage 1 Systolic pressure: 130-139. Diastolic pressure: 80-89. Hypertension stage 2 Systolic pressure: 140 or above. Diastolic pressure: 90 or above. How can this condition affect me? Managing your hypertension is an important responsibility. Over time, hypertension can damage the arteries and decrease blood flow to important parts of the body, including the brain, heart, and kidneys. Having untreated or uncontrolled hypertension can lead to: A heart attack. A stroke. A weakened blood vessel (aneurysm). Heart failure. Kidney damage. Eye damage. Metabolic syndrome. Memory and concentration problems. Vascular dementia. What actions can I take to manage this condition? Hypertension can be managed by making lifestyle changes and  possibly by taking medicines. Your health care provider will help you make a plan to bring your blood pressure within a normal range. Nutrition  Eat a diet that is high in fiber and potassium, and low in salt (sodium), added sugar, and fat. An example eating plan is called the Dietary Approaches to Stop Hypertension (DASH) diet. To eat this way: Eat plenty of fresh fruits and vegetables. Try to fill one-half of your plate at each meal with fruits and vegetables. Eat whole grains, such as whole-wheat pasta, brown rice, or whole-grain bread. Fill about one-fourth of your plate with whole grains. Eat low-fat dairy products. Avoid fatty cuts of meat, processed or cured meats, and poultry with skin. Fill about one-fourth of your plate with lean proteins such as fish, chicken without skin, beans, eggs, and tofu. Avoid pre-made and processed foods. These tend to be higher in sodium, added sugar, and fat. Reduce your daily sodium intake. Most people with hypertension should eat less than 1,500 mg of sodium a day. Lifestyle  Work with your health care provider to maintain a healthy body weight or to lose weight. Ask what an ideal weight is for you. Get at least 30 minutes of exercise that causes your heart to beat faster (aerobic exercise) most days of the week. Activities may include walking, swimming, or biking. Include exercise to strengthen your muscles (resistance exercise), such as weight lifting, as part of your weekly exercise routine. Try to do these types of exercises for 30 minutes at least 3 days a week. Do not use any products that contain nicotine or tobacco, such as cigarettes, e-cigarettes, and chewing tobacco. If you need help quitting, ask your health care provider. Control any long-term (chronic) conditions you have, such as high cholesterol or diabetes. Identify your sources of stress and find ways to manage stress. This may include meditation, deep breathing, or making time for fun  activities. Alcohol use Do not drink alcohol if: Your health care provider tells you not to drink. You are pregnant, may be pregnant, or are planning to become pregnant. If you drink alcohol: Limit how much you use to: 0-1 drink a day for  women. 0-2 drinks a day for men. Be aware of how much alcohol is in your drink. In the [place] one drink equals one 12 oz bottle of beer (355 mL), one 5 oz glass of wine (148 mL), or one 1 oz glass of hard liquor (44 mL). Medicines Your health care provider may prescribe medicine if lifestyle changes are not enough to get your blood pressure under control and if: Your systolic blood pressure is 130 or higher. Your diastolic blood pressure is 80 or higher. Take medicines only as told by your health care provider. Follow the directions carefully. Blood pressure medicines must be taken as told by your health care provider. The medicine does not work as well when you skip doses. Skipping doses also puts you at risk for problems. Monitoring Before you monitor your blood pressure: Do not smoke, drink caffeinated beverages, or exercise within 30 minutes before taking a measurement. Use the bathroom and empty your bladder (urinate). Sit quietly for at least 5 minutes before taking measurements. Monitor your blood pressure at home as told by your health care provider. To do this: Sit with your back straight and supported. Place your feet flat on the floor. Do not cross your legs. Support your arm on a flat surface, such as a table. Make sure your upper arm is at heart level. Each time you measure, take two or three readings one minute apart and record the results. You may also need to have your blood pressure checked regularly by your health care provider. General information Talk with your health care provider about your diet, exercise habits, and other lifestyle factors that may be contributing to hypertension. Review all the medicines you take with your health  care provider because there may be side effects or interactions. Keep all visits as told by your health care provider. Your health care provider can help you create and adjust your plan for managing your high blood pressure. Where to find more information National Heart, Lung, and Blood Institute: PopSteam.is American Heart Association: www.heart.org Contact a health care provider if: You think you are having a reaction to medicines you have taken. You have repeated (recurrent) headaches. You feel dizzy. You have swelling in your ankles. You have trouble with your vision. Get help right away if: You develop a severe headache or confusion. You have unusual weakness or numbness, or you feel faint. You have severe pain in your chest or abdomen. You vomit repeatedly. You have trouble breathing. These symptoms may represent a serious problem that is an emergency. Do not wait to see if the symptoms will go away. Get medical help right away. Call your local emergency services (911 in the [place] Do not drive yourself to the hospital. Summary Hypertension is when the force of blood pumping through your arteries is too strong. If this condition is not controlled, it may put you at risk for serious complications. Your personal target blood pressure may vary depending on your medical conditions, your age, and other factors. For most people, a normal blood pressure is less than 120/80. Hypertension is managed by lifestyle changes, medicines, or both. Lifestyle changes to help manage hypertension include losing weight, eating a healthy, low-sodium diet, exercising more, stopping smoking, and limiting alcohol. This information is not intended to replace advice given to you by your health care provider. Make sure you discuss any questions you have with your health care provider. Document Revised: 01/06/2020 Document Reviewed: 11/01/2019 Elsevier Patient Education  2022 [employer].
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
## 2021-08-26 ENCOUNTER — Ambulatory Visit (HOSPITAL_COMMUNITY)
Admission: EM | Admit: 2021-08-26 | Discharge: 2021-08-26 | Disposition: A | Discharge status: Home / Self Care | Payer: Medicaid Other | Attending: Internal Medicine | Admitting: Internal Medicine

## 2021-08-26 ENCOUNTER — Encounter (HOSPITAL_COMMUNITY): Payer: Self-pay | Admitting: Emergency Medicine

## 2021-08-26 ENCOUNTER — Other Ambulatory Visit: Payer: Self-pay

## 2021-08-26 DIAGNOSIS — J069 Acute upper respiratory infection, unspecified: Secondary | ICD-10-CM | POA: Diagnosis not present

## 2021-08-26 DIAGNOSIS — L02416 Cutaneous abscess of left lower limb: Secondary | ICD-10-CM

## 2021-08-26 MED ORDER — SULFAMETHOXAZOLE-TRIMETHOPRIM 800-160 MG PO TABS: 1.0000 | ORAL_TABLET | Freq: Two times a day (BID) | ORAL | 0 refills | Status: DC

## 2021-08-26 MED ORDER — IBUPROFEN 600 MG PO TABS: 600.0000 mg | ORAL_TABLET | Freq: Four times a day (QID) | ORAL | 0 refills | Status: DC | PRN

## 2021-08-26 MED ORDER — LORATADINE 10 MG PO TABS: 10.0000 mg | ORAL_TABLET | Freq: Every day | ORAL | 0 refills | Status: DC

## 2021-08-26 NOTE — ED Triage Notes (Signed)
Pt reports has spider bite on back of RLE since Saturday. Denies drainage. Has swelling and redness. Also having sore throat. Reports has bump on right chest "for a while" and forgets to tell PCP about it. Denies pain with knot on chest

## 2021-08-26 NOTE — Discharge Instructions (Addendum)
Warm compresses to 3 times a day If symptoms worsen i.e.-worsening swelling, pain, erythema, discharge-please return to urgent care to be reevaluated. Take medications as prescribed.

## 2021-08-28 ENCOUNTER — Emergency Department (HOSPITAL_COMMUNITY)
Admission: EM | Admit: 2021-08-28 | Discharge: 2021-08-28 | Disposition: A | Discharge status: Home / Self Care | Payer: Medicaid Other | Attending: Emergency Medicine | Admitting: Emergency Medicine

## 2021-08-28 ENCOUNTER — Encounter (HOSPITAL_COMMUNITY): Payer: Self-pay

## 2021-08-28 ENCOUNTER — Encounter: Payer: Self-pay | Admitting: Nurse Practitioner

## 2021-08-28 ENCOUNTER — Ambulatory Visit (INDEPENDENT_AMBULATORY_CARE_PROVIDER_SITE_OTHER): Payer: Medicaid Other | Admitting: Nurse Practitioner

## 2021-08-28 ENCOUNTER — Other Ambulatory Visit: Payer: Self-pay

## 2021-08-28 VITALS — BP 136/96 | HR 76 | Temp 97.4°F | Ht 74.0 in | Wt 231.4 lb

## 2021-08-28 DIAGNOSIS — W57XXXA Bitten or stung by nonvenomous insect and other nonvenomous arthropods, initial encounter: Secondary | ICD-10-CM | POA: Diagnosis not present

## 2021-08-28 DIAGNOSIS — S80861A Insect bite (nonvenomous), right lower leg, initial encounter: Secondary | ICD-10-CM | POA: Diagnosis not present

## 2021-08-28 DIAGNOSIS — Z5321 Procedure and treatment not carried out due to patient leaving prior to being seen by health care provider: Secondary | ICD-10-CM | POA: Diagnosis not present

## 2021-08-28 DIAGNOSIS — T63301A Toxic effect of unspecified spider venom, accidental (unintentional), initial encounter: Secondary | ICD-10-CM | POA: Diagnosis not present

## 2021-08-28 MED ORDER — DOXYCYCLINE HYCLATE 100 MG PO CAPS: 100.0000 mg | ORAL_CAPSULE | Freq: Two times a day (BID) | ORAL | 0 refills | Status: AC
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
## 2021-08-28 NOTE — ED Triage Notes (Signed)
Pt reports being bit by a spider on Saturday. Pt was seen and treated by urgent care 08/26/2021. Pt complains of swelling and pain to the RLE.
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
## 2021-09-11 ENCOUNTER — Ambulatory Visit: Payer: Medicaid Other | Admitting: Nurse Practitioner

## 2021-09-11 LAB — WOUND CULTURE

## 2021-09-12 ENCOUNTER — Ambulatory Visit: Payer: Medicaid Other | Admitting: Nurse Practitioner

## 2021-11-21 ENCOUNTER — Other Ambulatory Visit: Payer: Self-pay

## 2021-11-21 ENCOUNTER — Ambulatory Visit (INDEPENDENT_AMBULATORY_CARE_PROVIDER_SITE_OTHER): Payer: Medicaid Other | Admitting: Nurse Practitioner

## 2021-11-21 VITALS — BP 131/85 | HR 71 | Temp 97.2°F | Ht 74.0 in | Wt 244.5 lb

## 2021-11-21 DIAGNOSIS — I10 Essential (primary) hypertension: Secondary | ICD-10-CM | POA: Diagnosis not present

## 2021-11-21 DIAGNOSIS — Z13220 Encounter for screening for lipoid disorders: Secondary | ICD-10-CM
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
## 2021-11-21 NOTE — Patient Instructions (Signed)

## 2021-11-22 ENCOUNTER — Encounter: Payer: Self-pay | Admitting: Nurse Practitioner

## 2021-11-22 LAB — COMP. METABOLIC PANEL (12)
AST: 29 IU/L (ref 0–40)
Albumin/Globulin Ratio: 2.2 (ref 1.2–2.2)
Albumin: 5.1 g/dL — ABNORMAL HIGH (ref 4.0–5.0)
Alkaline Phosphatase: 113 IU/L (ref 44–121)
BUN/Creatinine Ratio: 12 (ref 9–20)
BUN: 12 mg/dL (ref 6–20)
Bilirubin Total: 0.3 mg/dL (ref 0.0–1.2)
Calcium: 9.9 mg/dL (ref 8.7–10.2)
Chloride: 102 mmol/L (ref 96–106)
Creatinine, Ser: 0.99 mg/dL (ref 0.76–1.27)
Globulin, Total: 2.3 g/dL (ref 1.5–4.5)
Glucose: 91 mg/dL (ref 70–99)
Potassium: 4.5 mmol/L (ref 3.5–5.2)
Sodium: 141 mmol/L (ref 134–144)
Total Protein: 7.4 g/dL (ref 6.0–8.5)
eGFR: 101 mL/min/{1.73_m2} (ref >59)

## 2021-11-22 LAB — LIPID PANEL
Chol/HDL Ratio: 4.5 ratio (ref 0.0–5.0)
Cholesterol, Total: 224 mg/dL — ABNORMAL HIGH (ref 100–199)
HDL: 50 mg/dL (ref >39)
LDL Chol Calc (NIH): 161 mg/dL — ABNORMAL HIGH (ref 0–99)
Triglycerides: 76 mg/dL (ref 0–149)
VLDL Cholesterol Cal: 13 mg/dL (ref 5–40)

## 2021-12-25 ENCOUNTER — Ambulatory Visit
Admission: EM | Admit: 2021-12-25 | Discharge: 2021-12-25 | Disposition: A | Discharge status: Home / Self Care | Payer: Medicaid Other | Attending: Emergency Medicine | Admitting: Emergency Medicine

## 2021-12-25 ENCOUNTER — Other Ambulatory Visit: Payer: Self-pay

## 2021-12-25 DIAGNOSIS — R42 Dizziness and giddiness: Secondary | ICD-10-CM

## 2021-12-25 DIAGNOSIS — F419 Anxiety disorder, unspecified: Secondary | ICD-10-CM

## 2021-12-25 DIAGNOSIS — J45990 Exercise induced bronchospasm: Secondary | ICD-10-CM

## 2021-12-25 DIAGNOSIS — R0602 Shortness of breath: Secondary | ICD-10-CM

## 2021-12-25 MED ORDER — AEROCHAMBER PLUS FLO-VU LARGE MISC: 1.0000 | Freq: Once | 0 refills | Status: AC

## 2021-12-25 MED ORDER — ALBUTEROL SULFATE HFA 108 (90 BASE) MCG/ACT IN AERS: 2.0000 | INHALATION_SPRAY | Freq: Four times a day (QID) | RESPIRATORY_TRACT | 2 refills | Status: DC | PRN
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
## 2021-12-25 NOTE — ED Triage Notes (Addendum)
Pt c/o chest pain (medial that radiates to right and left chest, intermittent, patient states it is noticeable but not sharp or pressure), fatigue, and SOB that started today. Pt does not display s/s of respiratory distress at this time.
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
## 2021-12-26 LAB — SARS-COV-2, NAA 2 DAY TAT

## 2021-12-26 LAB — NOVEL CORONAVIRUS, NAA: SARS-CoV-2, NAA: NOT DETECTED

## 2022-01-16 ENCOUNTER — Other Ambulatory Visit: Payer: Self-pay

## 2022-01-16 ENCOUNTER — Encounter: Payer: Self-pay | Admitting: Nurse Practitioner

## 2022-01-16 ENCOUNTER — Ambulatory Visit (INDEPENDENT_AMBULATORY_CARE_PROVIDER_SITE_OTHER): Payer: Medicaid Other | Admitting: Nurse Practitioner

## 2022-01-16 VITALS — BP 136/83 | HR 79 | Temp 97.8°F | Ht 74.0 in | Wt 234.1 lb

## 2022-01-16 DIAGNOSIS — R052 Subacute cough: Secondary | ICD-10-CM | POA: Diagnosis not present

## 2022-01-16 DIAGNOSIS — R6889 Other general symptoms and signs: Secondary | ICD-10-CM

## 2022-01-16 MED ORDER — BENZONATATE 100 MG PO CAPS: 100.0000 mg | ORAL_CAPSULE | Freq: Three times a day (TID) | ORAL | 0 refills | Status: AC | PRN

## 2022-01-16 MED ORDER — AMOXICILLIN-POT CLAVULANATE 875-125 MG PO TABS: 1.0000 | ORAL_TABLET | Freq: Two times a day (BID) | ORAL | 0 refills | Status: AC

## 2022-01-16 NOTE — Patient Instructions (Signed)
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
## 2022-01-18 ENCOUNTER — Encounter: Payer: Self-pay | Admitting: Nurse Practitioner

## 2022-01-19 LAB — UPPER RESPIRATORY CULTURE, ROUTINE

## 2022-01-24 ENCOUNTER — Telehealth: Payer: Self-pay | Admitting: Nurse Practitioner

## 2022-01-24 NOTE — Telephone Encounter (Signed)
Patient left VM on 2/10 stating the meds prescribed for respiratory infection making ear hurt and chest still hurts. Would like advice on further treatment

## 2022-01-31 ENCOUNTER — Other Ambulatory Visit: Payer: Self-pay

## 2022-01-31 ENCOUNTER — Encounter: Payer: Self-pay | Admitting: Nurse Practitioner

## 2022-01-31 ENCOUNTER — Ambulatory Visit (INDEPENDENT_AMBULATORY_CARE_PROVIDER_SITE_OTHER): Payer: Medicaid Other | Admitting: Nurse Practitioner

## 2022-01-31 VITALS — BP 139/95 | HR 97 | Temp 97.2°F | Ht 74.0 in | Wt 234.0 lb

## 2022-01-31 DIAGNOSIS — J069 Acute upper respiratory infection, unspecified: Secondary | ICD-10-CM | POA: Diagnosis not present

## 2022-01-31 DIAGNOSIS — I10 Essential (primary) hypertension: Secondary | ICD-10-CM | POA: Diagnosis not present
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
## 2022-02-21 ENCOUNTER — Encounter: Payer: Self-pay | Admitting: Nurse Practitioner

## 2022-02-21 ENCOUNTER — Other Ambulatory Visit: Payer: Self-pay

## 2022-02-21 ENCOUNTER — Ambulatory Visit (INDEPENDENT_AMBULATORY_CARE_PROVIDER_SITE_OTHER): Payer: Self-pay | Admitting: Nurse Practitioner

## 2022-02-21 VITALS — BP 125/80 | HR 92 | Temp 98.0°F | Ht 74.0 in | Wt 234.6 lb

## 2022-02-21 DIAGNOSIS — Z13220 Encounter for screening for lipoid disorders: Secondary | ICD-10-CM

## 2022-02-21 DIAGNOSIS — I10 Essential (primary) hypertension: Secondary | ICD-10-CM
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
## 2022-02-21 NOTE — Patient Instructions (Signed)
Heart-Healthy Eating Plan Many factors influence your heart (coronary) health, including eating and exercise habits. Coronary risk increases with abnormal blood fat (lipid) levels. Heart-healthy meal planning includes limiting unhealthy fats, increasing healthy fats, and making other diet and lifestyle changes. What is my plan? Your health care provider may recommend that you: Limit your fat intake to _________% or less of your total calories each day. Limit your saturated fat intake to _________% or less of your total calories each day. Limit the amount of cholesterol in your diet to less than _________ mg per day. What are tips for following this plan? Cooking Cook foods using methods other than frying. Baking, boiling, grilling, and broiling are all good options. Other ways to reduce fat include: Removing the skin from poultry. Removing all visible fats from meats. Steaming vegetables in water or broth. Meal planning  At meals, imagine dividing your plate into fourths: Fill one-half of your plate with vegetables and green salads. Fill one-fourth of your plate with whole grains. Fill one-fourth of your plate with lean protein foods. Eat 4-5 servings of vegetables per day. One serving equals 1 cup raw or cooked vegetable, or 2 cups raw leafy greens. Eat 4-5 servings of fruit per day. One serving equals 1 medium whole fruit,  cup dried fruit,  cup fresh, frozen, or canned fruit, or  cup 100% fruit juice. Eat more foods that contain soluble fiber. Examples include apples, broccoli, carrots, beans, peas, and barley. Aim to get 25-30 g of fiber per day. Increase your consumption of legumes, nuts, and seeds to 4-5 servings per week. One serving of dried beans or legumes equals  cup cooked, 1 serving of nuts is  cup, and 1 serving of seeds equals 1 tablespoon. Fats Choose healthy fats more often. Choose monounsaturated and polyunsaturated fats, such as olive and canola oils, flaxseeds,  walnuts, almonds, and seeds. Eat more omega-3 fats. Choose salmon, mackerel, sardines, tuna, flaxseed oil, and ground flaxseeds. Aim to eat fish at least 2 times each week. Check food labels carefully to identify foods with trans fats or high amounts of saturated fat. Limit saturated fats. These are found in animal products, such as meats, butter, and cream. Plant sources of saturated fats include palm oil, palm kernel oil, and coconut oil. Avoid foods with partially hydrogenated oils in them. These contain trans fats. Examples are stick margarine, some tub margarines, cookies, crackers, and other baked goods. Avoid fried foods. General information Eat more home-cooked food and less restaurant, buffet, and fast food. Limit or avoid alcohol. Limit foods that are high in starch and sugar. Lose weight if you are overweight. Losing just 5-10% of your body weight can help your overall health and prevent diseases such as diabetes and heart disease. Monitor your salt (sodium) intake, especially if you have high blood pressure. Talk with your health care provider about your sodium intake. Try to incorporate more vegetarian meals weekly. What foods can I eat? Fruits All fresh, canned (in natural juice), or frozen fruits. Vegetables Fresh or frozen vegetables (raw, steamed, roasted, or grilled). Green salads. Grains Most grains. Choose whole wheat and whole grains most of the time. Rice and pasta, including brown rice and pastas made with whole wheat. Meats and other proteins Lean, well-trimmed beef, veal, pork, and lamb. Chicken and turkey without skin. All fish and shellfish. Wild duck, rabbit, pheasant, and venison. Egg whites or low-cholesterol egg substitutes. Dried beans, peas, lentils, and tofu. Seeds and most nuts. Dairy Low-fat or nonfat cheeses,   including ricotta and mozzarella. Skim or 1% milk (liquid, powdered, or evaporated). Buttermilk made with low-fat milk. Nonfat or low-fat  yogurt. Fats and oils Non-hydrogenated (trans-free) margarines. Vegetable oils, including soybean, sesame, sunflower, olive, peanut, safflower, corn, canola, and cottonseed. Salad dressings or mayonnaise made with a vegetable oil. Beverages Water (mineral or sparkling). Coffee and tea. Diet carbonated beverages. Sweets and desserts Sherbet, gelatin, and fruit ice. Small amounts of dark chocolate. Limit all sweets and desserts. Seasonings and condiments All seasonings and condiments. The items listed above may not be a complete list of foods and beverages you can eat. Contact a dietitian for more options. What foods are not recommended? Fruits Canned fruit in heavy syrup. Fruit in cream or butter sauce. Fried fruit. Limit coconut. Vegetables Vegetables cooked in cheese, cream, or butter sauce. Fried vegetables. Grains Breads made with saturated or trans fats, oils, or whole milk. Croissants. Sweet rolls. Donuts. High-fat crackers, such as cheese crackers. Meats and other proteins Fatty meats, such as hot dogs, ribs, sausage, bacon, rib-eye roast or steak. High-fat deli meats, such as salami and bologna. Caviar. Domestic duck and goose. Organ meats, such as liver. Dairy Cream, sour cream, cream cheese, and creamed cottage cheese. Whole-milk cheeses. Whole or 2% milk (liquid, evaporated, or condensed). Whole buttermilk. Cream sauce or high-fat cheese sauce. Whole-milk yogurt. Fats and oils Meat fat, or shortening. Cocoa butter, hydrogenated oils, palm oil, coconut oil, palm kernel oil. Solid fats and shortenings, including bacon fat, salt pork, lard, and butter. Nondairy cream substitutes. Salad dressings with cheese or sour cream. Beverages Regular sodas and any drinks with added sugar. Sweets and desserts Frosting. Pudding. Cookies. Cakes. Pies. Milk chocolate or white chocolate. Buttered syrups. Full-fat ice cream or ice cream drinks. The items listed above may not be a complete list of  foods and beverages to avoid. Contact a dietitian for more information. Summary Heart-healthy meal planning includes limiting unhealthy fats, increasing healthy fats, and making other diet and lifestyle changes. Lose weight if you are overweight. Losing just 5-10% of your body weight can help your overall health and prevent diseases such as diabetes and heart disease. Focus on eating a balance of foods, including fruits and vegetables, low-fat or nonfat dairy, lean protein, nuts and legumes, whole grains, and heart-healthy oils and fats. This information is not intended to replace advice given to you by your health care provider. Make sure you discuss any questions you have with your health care provider. Document Revised: 04/11/2021 Document Reviewed: 04/11/2021 Elsevier Patient Education  2022 Elsevier Inc.  

## 2022-02-22 LAB — LIPID PANEL
Chol/HDL Ratio: 4.4 ratio (ref 0.0–5.0)
Cholesterol, Total: 201 mg/dL — ABNORMAL HIGH (ref 100–199)
HDL: 46 mg/dL (ref >39)
LDL Chol Calc (NIH): 107 mg/dL — ABNORMAL HIGH (ref 0–99)
Triglycerides: 280 mg/dL — ABNORMAL HIGH (ref 0–149)
VLDL Cholesterol Cal: 48 mg/dL — ABNORMAL HIGH (ref 5–40)

## 2022-02-22 LAB — COMP. METABOLIC PANEL (12)
AST: 30 IU/L (ref 0–40)
Albumin/Globulin Ratio: 2 (ref 1.2–2.2)
Albumin: 4.9 g/dL (ref 4.0–5.0)
Alkaline Phosphatase: 127 IU/L — ABNORMAL HIGH (ref 44–121)
BUN/Creatinine Ratio: 15 (ref 9–20)
BUN: 19 mg/dL (ref 6–20)
Bilirubin Total: 0.7 mg/dL (ref 0.0–1.2)
Calcium: 9.9 mg/dL (ref 8.7–10.2)
Chloride: 100 mmol/L (ref 96–106)
Creatinine, Ser: 1.26 mg/dL (ref 0.76–1.27)
Globulin, Total: 2.5 g/dL (ref 1.5–4.5)
Glucose: 85 mg/dL (ref 70–99)
Potassium: 4.6 mmol/L (ref 3.5–5.2)
Sodium: 142 mmol/L (ref 134–144)
Total Protein: 7.4 g/dL (ref 6.0–8.5)
eGFR: 75 mL/min/{1.73_m2} (ref >59)

## 2022-05-26 ENCOUNTER — Ambulatory Visit: Payer: Medicaid Other | Admitting: Nurse Practitioner

## 2022-05-28 ENCOUNTER — Encounter: Payer: Self-pay | Admitting: Nurse Practitioner

## 2022-05-28 ENCOUNTER — Ambulatory Visit (INDEPENDENT_AMBULATORY_CARE_PROVIDER_SITE_OTHER): Payer: Medicaid Other | Admitting: Nurse Practitioner

## 2022-05-28 VITALS — BP 130/83 | HR 106 | Temp 98.2°F | Ht 74.0 in | Wt 240.8 lb

## 2022-05-28 DIAGNOSIS — I10 Essential (primary) hypertension: Secondary | ICD-10-CM
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
## 2022-05-28 NOTE — Patient Instructions (Signed)
You were seen today in the [facility] for reevaluation of chronic illness. Labs were collected, results will be available via MyChart or, if abnormal, you will be contacted by clinic staff. You were prescribed medications, please take as directed. Please follow up in 3 mths for reevaluation of hyperlipidemia

## 2022-05-29 LAB — LIPID PANEL
Chol/HDL Ratio: 4.6 ratio (ref 0.0–5.0)
Cholesterol, Total: 201 mg/dL — ABNORMAL HIGH (ref 100–199)
HDL: 44 mg/dL (ref >39)
LDL Chol Calc (NIH): 132 mg/dL — ABNORMAL HIGH (ref 0–99)
Triglycerides: 141 mg/dL (ref 0–149)
VLDL Cholesterol Cal: 25 mg/dL (ref 5–40)

## 2022-07-30 ENCOUNTER — Telehealth: Payer: Self-pay

## 2022-07-30 NOTE — Telephone Encounter (Signed)
Please put in orders for STD check

## 2022-07-31 ENCOUNTER — Other Ambulatory Visit: Payer: Medicaid Other

## 2022-07-31 ENCOUNTER — Other Ambulatory Visit: Payer: Self-pay | Admitting: Nurse Practitioner

## 2022-07-31 DIAGNOSIS — Z113 Encounter for screening for infections with a predominantly sexual mode of transmission: Secondary | ICD-10-CM

## 2022-07-31 DIAGNOSIS — A599 Trichomoniasis, unspecified: Secondary | ICD-10-CM

## 2022-08-02 LAB — RPR+HIV+GC+CT PANEL
Chlamydia trachomatis, NAA: NEGATIVE
HIV Screen 4th Generation wRfx: NONREACTIVE
Neisseria Gonorrhoeae by PCR: NEGATIVE
RPR Ser Ql: NONREACTIVE

## 2022-08-04 ENCOUNTER — Other Ambulatory Visit: Payer: Self-pay | Admitting: Nurse Practitioner

## 2022-08-04 LAB — SPECIMEN STATUS REPORT

## 2022-08-04 LAB — TRICHOMONAS VAGINALIS, PROBE AMP: Trich vag by NAA: NEGATIVE

## 2022-08-04 MED ORDER — METRONIDAZOLE 500 MG PO TABS
2000.0000 mg | ORAL_TABLET | Freq: Every day | ORAL | 0 refills | Status: AC
Start: 2022-08-04 — End: 2022-08-05

## 2022-08-28 ENCOUNTER — Ambulatory Visit (HOSPITAL_COMMUNITY)
Admission: RE | Admit: 2022-08-28 | Discharge: 2022-08-28 | Disposition: A | Discharge status: Home / Self Care | Payer: Medicaid Other | Source: Ambulatory Visit | Attending: Nurse Practitioner | Admitting: Nurse Practitioner

## 2022-08-28 ENCOUNTER — Ambulatory Visit (INDEPENDENT_AMBULATORY_CARE_PROVIDER_SITE_OTHER): Payer: Medicaid Other | Admitting: Nurse Practitioner

## 2022-08-28 ENCOUNTER — Encounter: Payer: Self-pay | Admitting: Nurse Practitioner

## 2022-08-28 VITALS — BP 147/94 | HR 65 | Temp 97.7°F | Ht 74.0 in | Wt 250.0 lb

## 2022-08-28 DIAGNOSIS — M79642 Pain in left hand: Secondary | ICD-10-CM | POA: Insufficient documentation

## 2022-08-28 DIAGNOSIS — Z113 Encounter for screening for infections with a predominantly sexual mode of transmission: Secondary | ICD-10-CM | POA: Diagnosis not present

## 2022-08-28 DIAGNOSIS — I10 Essential (primary) hypertension: Secondary | ICD-10-CM

## 2022-08-28 MED ORDER — AMLODIPINE BESYLATE 5 MG PO TABS: 5.0000 mg | ORAL_TABLET | Freq: Every day | ORAL | 3 refills | Status: DC

## 2022-08-28 NOTE — Assessment & Plan Note (Signed)
-   CBC - Comprehensive metabolic panel - amLODipine (NORVASC) 5 MG tablet; Take 1 tablet (5 mg total) by mouth daily.  Dispense: 90 tablet; Refill: 3  2. Screening examination for STD (sexually transmitted disease)  - Chlamydia/Gonococcus/Trichomonas, NAA  3. Left hand pain  - DG Hand Complete Left  Follow up:  Follow up in 6 months

## 2022-08-28 NOTE — Patient Instructions (Signed)
1. Essential hypertension  - CBC - Comprehensive metabolic panel - amLODipine (NORVASC) 5 MG tablet; Take 1 tablet (5 mg total) by mouth daily.  Dispense: 90 tablet; Refill: 3  2. Screening examination for STD (sexually transmitted disease)  - Chlamydia/Gonococcus/Trichomonas, NAA  3. Left hand pain  - DG Hand Complete Left  Follow up:  Follow up in 6 months
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
## 2022-08-29 LAB — CBC
Hematocrit: 46.9 % (ref 37.5–51.0)
Hemoglobin: 15.8 g/dL (ref 13.0–17.7)
MCH: 31.2 pg (ref 26.6–33.0)
MCHC: 33.7 g/dL (ref 31.5–35.7)
MCV: 93 fL (ref 79–97)
Platelets: 210 10*3/uL (ref 150–450)
RBC: 5.06 x10E6/uL (ref 4.14–5.80)
RDW: 13.8 % (ref 11.6–15.4)
WBC: 7.2 10*3/uL (ref 3.4–10.8)

## 2022-08-29 LAB — COMPREHENSIVE METABOLIC PANEL
ALT: 40 IU/L (ref 0–44)
AST: 24 IU/L (ref 0–40)
Albumin/Globulin Ratio: 1.9 (ref 1.2–2.2)
Albumin: 5 g/dL (ref 4.1–5.1)
Alkaline Phosphatase: 96 IU/L (ref 44–121)
BUN/Creatinine Ratio: 14 (ref 9–20)
BUN: 14 mg/dL (ref 6–20)
Bilirubin Total: 0.4 mg/dL (ref 0.0–1.2)
CO2: 24 mmol/L (ref 20–29)
Calcium: 9.8 mg/dL (ref 8.7–10.2)
Chloride: 103 mmol/L (ref 96–106)
Creatinine, Ser: 0.98 mg/dL (ref 0.76–1.27)
Globulin, Total: 2.7 g/dL (ref 1.5–4.5)
Glucose: 89 mg/dL (ref 70–99)
Potassium: 4.5 mmol/L (ref 3.5–5.2)
Sodium: 142 mmol/L (ref 134–144)
Total Protein: 7.7 g/dL (ref 6.0–8.5)
eGFR: 102 mL/min/{1.73_m2} (ref >59)

## 2022-08-31 LAB — CHLAMYDIA/GONOCOCCUS/TRICHOMONAS, NAA
Chlamydia by NAA: NEGATIVE
Gonococcus by NAA: NEGATIVE
Trich vag by NAA: NEGATIVE

## 2022-09-09 ENCOUNTER — Telehealth: Payer: Self-pay

## 2022-09-10 NOTE — Telephone Encounter (Signed)
Male partners do not need treatment for BV. Thanks.

## 2022-09-12 NOTE — Telephone Encounter (Signed)
Attempted to call patient with no answer. Please have him come in for STD screening / lab visit if needed. Thanks.

## 2022-09-15 ENCOUNTER — Other Ambulatory Visit: Payer: Self-pay | Admitting: Nurse Practitioner

## 2022-09-15 ENCOUNTER — Other Ambulatory Visit: Payer: Self-pay

## 2022-09-15 DIAGNOSIS — R0683 Snoring: Secondary | ICD-10-CM

## 2022-09-15 DIAGNOSIS — Z113 Encounter for screening for infections with a predominantly sexual mode of transmission: Secondary | ICD-10-CM

## 2022-09-18 LAB — CHLAMYDIA/GONOCOCCUS/TRICHOMONAS, NAA
Chlamydia by NAA: NEGATIVE
Gonococcus by NAA: NEGATIVE
Trich vag by NAA: NEGATIVE

## 2022-09-30 ENCOUNTER — Institutional Professional Consult (permissible substitution): Payer: Medicaid Other | Admitting: Adult Health

## 2022-10-15 ENCOUNTER — Institutional Professional Consult (permissible substitution): Payer: Medicaid Other | Admitting: Adult Health

## 2022-10-25 ENCOUNTER — Other Ambulatory Visit: Payer: Self-pay

## 2022-10-25 DIAGNOSIS — R0602 Shortness of breath: Secondary | ICD-10-CM | POA: Diagnosis not present

## 2022-10-25 DIAGNOSIS — R079 Chest pain, unspecified: Secondary | ICD-10-CM | POA: Diagnosis present

## 2022-10-25 DIAGNOSIS — J45909 Unspecified asthma, uncomplicated: Secondary | ICD-10-CM | POA: Diagnosis not present

## 2022-10-25 NOTE — ED Triage Notes (Addendum)
Pt c/o centralized chest pain and SOB onset 30 min prior to arrival while at rest. Hx asthma. Denies drug use.

## 2022-10-26 ENCOUNTER — Emergency Department (HOSPITAL_BASED_OUTPATIENT_CLINIC_OR_DEPARTMENT_OTHER)
Admission: EM | Admit: 2022-10-26 | Discharge: 2022-10-26 | Disposition: A | Discharge status: Home / Self Care | Payer: Medicaid Other | Attending: Emergency Medicine | Admitting: Emergency Medicine

## 2022-10-26 ENCOUNTER — Emergency Department (HOSPITAL_BASED_OUTPATIENT_CLINIC_OR_DEPARTMENT_OTHER): Payer: Medicaid Other | Admitting: Radiology

## 2022-10-26 DIAGNOSIS — R079 Chest pain, unspecified: Secondary | ICD-10-CM

## 2022-10-26 LAB — CBC
HCT: 42.6 % (ref 39.0–52.0)
Hemoglobin: 14.6 g/dL (ref 13.0–17.0)
MCH: 31.7 pg (ref 26.0–34.0)
MCHC: 34.3 g/dL (ref 30.0–36.0)
MCV: 92.4 fL (ref 80.0–100.0)
Platelets: 205 10*3/uL (ref 150–400)
RBC: 4.61 MIL/uL (ref 4.22–5.81)
RDW: 13.2 % (ref 11.5–15.5)
WBC: 7.2 10*3/uL (ref 4.0–10.5)
nRBC: 0 % (ref 0.0–0.2)

## 2022-10-26 LAB — BASIC METABOLIC PANEL
Anion gap: 9 (ref 5–15)
BUN: 12 mg/dL (ref 6–20)
CO2: 26 mmol/L (ref 22–32)
Calcium: 9.1 mg/dL (ref 8.9–10.3)
Chloride: 105 mmol/L (ref 98–111)
Creatinine, Ser: 0.95 mg/dL (ref 0.61–1.24)
GFR, Estimated: >60 mL/min (ref >60)
Glucose, Bld: 117 mg/dL — ABNORMAL HIGH (ref 70–99)
Potassium: 3.6 mmol/L (ref 3.5–5.1)
Sodium: 140 mmol/L (ref 135–145)

## 2022-10-26 LAB — D-DIMER, QUANTITATIVE: D-Dimer, Quant: <0.27 ug/mL-FEU (ref 0.00–0.50)

## 2022-10-26 LAB — TROPONIN I (HIGH SENSITIVITY)
Troponin I (High Sensitivity): 2 ng/L (ref <18)
Troponin I (High Sensitivity): 2 ng/L (ref <18)

## 2022-10-26 MED ORDER — IPRATROPIUM-ALBUTEROL 0.5-2.5 (3) MG/3ML IN SOLN
3.0000 mL | Freq: Once | RESPIRATORY_TRACT | Status: AC
  Administered 2022-10-26: 3 mL via RESPIRATORY_TRACT
  Filled 2022-10-26: qty 3

## 2022-10-26 NOTE — ED Provider Notes (Signed)
[facility] EMERGENCY DEPT Provider Note   CSN: [id number] Arrival date & time: 10/25/22  2341     History  Chief Complaint  Patient presents with   Chest Pain    Joseph Bautista is a 36 y.o. male presented to ED with chest pain and shortness of breath.  He reports abrupt onset of symptoms yesterday evening, 30 minutes prior to arrival.  He does have a history of asthma.  He does not feel that he is wheezing.  Denies fevers, chills, coughing.  Reports he has pressure in his chest, and difficulty taking a breath.  Symptoms have abated somewhat during his overnight stay in the waiting room  HPI     Home Medications Prior to Admission medications   Medication Sig Start Date End Date Taking? Authorizing Provider  acetaminophen (TYLENOL) 325 MG tablet Take 2 tablets (650 mg total) by mouth every 4 (four) hours as needed for mild pain (do not take more than 4,000mg  in 24 hours). Patient not taking: Reported on 05/28/2022 03/20/20   Adam Phenix, PA-C  albuterol (VENTOLIN HFA) 108 (90 Base) MCG/ACT inhaler Inhale 2 puffs into the lungs every 6 (six) hours as needed for wheezing or shortness of breath (Cough, and/or prior to all physical activity). 12/25/21   Theadora Rama Scales, PA-C  amLODipine (NORVASC) 5 MG tablet Take 1 tablet (5 mg total) by mouth daily. 08/28/22   Ivonne Andrew, NP  ibuprofen (ADVIL) 600 MG tablet Take 1 tablet (600 mg total) by mouth every 6 (six) hours as needed. 08/26/21   Merrilee Jansky, MD  lamoTRIgine (LAMICTAL) 150 MG tablet Take 150 mg by mouth daily.    [provider]  loratadine (CLARITIN) 10 MG tablet Take 1 tablet (10 mg total) by mouth daily. 08/26/21 09/25/21  Merrilee Jansky, MD  Multiple Vitamin (MULTIVITAMIN WITH MINERALS) TABS tablet Take 1 tablet by mouth daily.    [provider]  sertraline (ZOLOFT) 50 MG tablet Take 50 mg by mouth daily. Patient not taking: Reported on 08/28/2022    [provider]  topiramate (TOPAMAX) 25 MG tablet Take 1 tablet (25 mg total) by mouth 2 (two) times daily. Patient not taking: No sig reported 06/09/18 09/02/19  Kallie Locks, FNP      Allergies    Hydrocodone, Tomato, Tomato, and Percocet [oxycodone-acetaminophen]    Review of Systems   Review of Systems  Physical Exam Updated Vital Signs BP (!) 141/88 (BP Location: Left Arm)   Pulse 70   Temp 98 F (36.7 C) (Oral)   Resp 14   SpO2 99%  Physical Exam Constitutional:      General: He is not in acute distress. HENT:     Head: Normocephalic and atraumatic.  Eyes:     Conjunctiva/sclera: Conjunctivae normal.     Pupils: Pupils are equal, round, and reactive to light.  Cardiovascular:     Rate and Rhythm: Normal rate and regular rhythm.  Pulmonary:     Effort: Pulmonary effort is normal. No respiratory distress.  Abdominal:     General: There is no distension.     Tenderness: There is no abdominal tenderness.  Skin:    General: Skin is warm and dry.  Neurological:     General: No focal deficit present.     Mental Status: He is alert. Mental status is at baseline.  Psychiatric:        Mood and Affect: Mood normal.  Behavior: Behavior normal.     ED Results / Procedures / Treatments   Labs (all labs ordered are listed, but only abnormal results are displayed) Labs Reviewed  BASIC METABOLIC PANEL - Abnormal; Notable for the following components:      Result Value   Glucose, Bld 117 (*)    All other components within normal limits  CBC  D-DIMER, QUANTITATIVE  TROPONIN I (HIGH SENSITIVITY)  TROPONIN I (HIGH SENSITIVITY)    EKG EKG Interpretation  Date/Time:  Saturday October 25 2022 23:52:56 EST Ventricular Rate:  96 PR Interval:  152 QRS Duration: 76 QT Interval:  338 QTC Calculation: 427 R Axis:   64 Text Interpretation: Normal sinus rhythm When compared with ECG of 25-Dec-2021 12:12, No significant change was found Confirmed by Alvester Chou  [phone]) on 10/26/2022 6:58:49 AM  Radiology DG Chest 2 View  Result Date: 10/26/2022 CLINICAL DATA:  Chest pain, shortness of breath. EXAM: CHEST - 2 VIEW COMPARISON:  09/02/2019. FINDINGS: The heart size and mediastinal contours are within normal limits. Both lungs are clear. No acute osseous abnormality. IMPRESSION: No active cardiopulmonary disease. Electronically Signed   By: Thornell Sartorius M.D.   On: 10/26/2022 00:59    Procedures Procedures    Medications Ordered in ED Medications  ipratropium-albuterol (DUONEB) 0.5-2.5 (3) MG/3ML nebulizer solution 3 mL (3 mLs Nebulization Given 10/26/22 0730)    ED Course/ Medical Decision Making/ A&P Clinical Course as of 10/26/22 0957  Sun Oct 26, 2022  1027 D-Dimer, Quant: <0.27 [MT]    Clinical Course User Index [MT] Terald Sleeper, MD                           Medical Decision Making Amount and/or Complexity of Data Reviewed Labs: ordered. Decision-making details documented in ED Course. Radiology: ordered.  Risk Prescription drug management.   This patient presents to the Emergency Department with complaint of chest pain. This involves an extensive number of treatment options, and is a complaint that carries with it a high risk of complications and morbidity, given the patient's comorbidity, including asthma.The differential diagnosis includes ACS vs Pneumothorax vs Reflux/Gastritis vs MSK pain vs Pneumonia vs other.  I felt PE was less likely given that D-dimer is negative, he has no other PE risk factors.  PERC negative.  I ordered, reviewed, and interpreted labs.  Pertinent results include troponins negative.  Labs unremarkable DuoNeb was given for the possibility of bronchospasm.  Otherwise did not hear any significant wheezing on exam. I ordered imaging studies which included x-ray of the chest I independently visualized and interpreted imaging which showed no focal infiltrate or pneumothorax and the monitor tracing  which showed normal sinus rhythm. I agree with the radiologist interpretation I personally reviewed the patients ECG which showed sinus rhythm with no acute ischemic findings  HEART score of 1 -overall low risk for MACE, and this is atypical for presentation of ACS, as it began at rest and is nonexertional  After the interventions stated above, I reevaluated the patient and found that they were stable and improved  Based on the patient's clinical exam, vital signs, risk factors, and ED testing, I felt that the patient's overall risk of life-threatening emergency such as ACS, PE, sepsis, or infection was low.  At this time, I felt the patient's presentation was most clinically consistent with nonspecific chest pain, but explained to the patient that this evaluation was not a definitive diagnostic workup.  I discussed outpatient follow up with primary care provider, and provided specialist office number on the patient's discharge paper if a referral was deemed necessary.  Return precautions were discussed with the patient.  I felt the patient was clinically stable for discharge.         Final Clinical Impression(s) / ED Diagnoses Final diagnoses:  Chest pain, unspecified type    Rx / DC Orders ED Discharge Orders     None         Diego Ulbricht, Kermit Balo, MD 10/26/22 [phone]

## 2022-12-12 ENCOUNTER — Emergency Department (HOSPITAL_BASED_OUTPATIENT_CLINIC_OR_DEPARTMENT_OTHER): Payer: Medicaid Other

## 2022-12-12 ENCOUNTER — Encounter (HOSPITAL_BASED_OUTPATIENT_CLINIC_OR_DEPARTMENT_OTHER): Payer: Self-pay | Admitting: Emergency Medicine

## 2022-12-12 ENCOUNTER — Other Ambulatory Visit: Payer: Self-pay

## 2022-12-12 ENCOUNTER — Emergency Department (HOSPITAL_BASED_OUTPATIENT_CLINIC_OR_DEPARTMENT_OTHER)
Admission: EM | Admit: 2022-12-12 | Discharge: 2022-12-12 | Disposition: A | Discharge status: Home / Self Care | Payer: Medicaid Other | Attending: Emergency Medicine | Admitting: Emergency Medicine

## 2022-12-12 DIAGNOSIS — K1121 Acute sialoadenitis: Secondary | ICD-10-CM

## 2022-12-12 DIAGNOSIS — Z112 Encounter for screening for other bacterial diseases: Secondary | ICD-10-CM | POA: Diagnosis not present

## 2022-12-12 DIAGNOSIS — Z79899 Other long term (current) drug therapy: Secondary | ICD-10-CM | POA: Insufficient documentation

## 2022-12-12 DIAGNOSIS — I10 Essential (primary) hypertension: Secondary | ICD-10-CM | POA: Insufficient documentation

## 2022-12-12 DIAGNOSIS — K1120 Sialoadenitis, unspecified: Secondary | ICD-10-CM | POA: Diagnosis not present

## 2022-12-12 DIAGNOSIS — Z1152 Encounter for screening for COVID-19: Secondary | ICD-10-CM | POA: Insufficient documentation

## 2022-12-12 DIAGNOSIS — D72829 Elevated white blood cell count, unspecified: Secondary | ICD-10-CM | POA: Diagnosis not present

## 2022-12-12 DIAGNOSIS — R221 Localized swelling, mass and lump, neck: Secondary | ICD-10-CM | POA: Diagnosis present

## 2022-12-12 LAB — CBC WITH DIFFERENTIAL/PLATELET
Abs Immature Granulocytes: 0.05 10*3/uL (ref 0.00–0.07)
Basophils Absolute: 0 10*3/uL (ref 0.0–0.1)
Basophils Relative: 0 %
Eosinophils Absolute: 0.1 10*3/uL (ref 0.0–0.5)
Eosinophils Relative: 1 %
HCT: 47.3 % (ref 39.0–52.0)
Hemoglobin: 16.1 g/dL (ref 13.0–17.0)
Immature Granulocytes: 0 %
Lymphocytes Relative: 27 %
Lymphs Abs: 3.5 10*3/uL (ref 0.7–4.0)
MCH: 31.6 pg (ref 26.0–34.0)
MCHC: 34 g/dL (ref 30.0–36.0)
MCV: 92.7 fL (ref 80.0–100.0)
Monocytes Absolute: 0.6 10*3/uL (ref 0.1–1.0)
Monocytes Relative: 5 %
Neutro Abs: 8.6 10*3/uL — ABNORMAL HIGH (ref 1.7–7.7)
Neutrophils Relative %: 67 %
Platelets: 232 10*3/uL (ref 150–400)
RBC: 5.1 MIL/uL (ref 4.22–5.81)
RDW: 12.9 % (ref 11.5–15.5)
WBC: 12.8 10*3/uL — ABNORMAL HIGH (ref 4.0–10.5)
nRBC: 0 % (ref 0.0–0.2)

## 2022-12-12 LAB — RESP PANEL BY RT-PCR (RSV, FLU A&B, COVID)  RVPGX2
Influenza A by PCR: NEGATIVE
Influenza B by PCR: NEGATIVE
Resp Syncytial Virus by PCR: NEGATIVE
SARS Coronavirus 2 by RT PCR: NEGATIVE

## 2022-12-12 LAB — BASIC METABOLIC PANEL
Anion gap: 8 (ref 5–15)
BUN: 20 mg/dL (ref 6–20)
CO2: 28 mmol/L (ref 22–32)
Calcium: 10.1 mg/dL (ref 8.9–10.3)
Chloride: 103 mmol/L (ref 98–111)
Creatinine, Ser: 1.06 mg/dL (ref 0.61–1.24)
GFR, Estimated: >60 mL/min (ref >60)
Glucose, Bld: 77 mg/dL (ref 70–99)
Potassium: 4.9 mmol/L (ref 3.5–5.1)
Sodium: 139 mmol/L (ref 135–145)

## 2022-12-12 MED ORDER — KETOROLAC TROMETHAMINE 15 MG/ML IJ SOLN
15.0000 mg | Freq: Once | INTRAMUSCULAR | Status: AC
  Administered 2022-12-12: 15 mg via INTRAVENOUS
  Filled 2022-12-12: qty 1

## 2022-12-12 MED ORDER — IOHEXOL 300 MG/ML  SOLN
100.0000 mL | Freq: Once | INTRAMUSCULAR | Status: AC | PRN
  Administered 2022-12-12: 75 mL via INTRAVENOUS

## 2022-12-12 MED ORDER — AMOXICILLIN-POT CLAVULANATE 875-125 MG PO TABS
1.0000 | ORAL_TABLET | Freq: Once | ORAL | Status: AC
  Administered 2022-12-12: 1 via ORAL
  Filled 2022-12-12: qty 1

## 2022-12-12 MED ORDER — AMOXICILLIN-POT CLAVULANATE 875-125 MG PO TABS: 1.0000 | ORAL_TABLET | Freq: Two times a day (BID) | ORAL | 0 refills | Status: DC

## 2022-12-12 NOTE — Discharge Instructions (Signed)
Peers to have an infection in the right gland.  You can try eating sour candy to help encourage saliva production and take the antibiotics.  Follow-up closely with your primary care doctor for recheck.  Come back to the ER for fevers, worsening swelling or pain or any other worrisome changes.

## 2022-12-12 NOTE — ED Provider Notes (Signed)
[facility] EMERGENCY DEPT Provider Note   CSN: [id number] Arrival date & time: 12/12/22  1446     History  Chief Complaint  Patient presents with   Facial Swelling    Joseph Bautista is a 36 y.o. male.  With past medical history of hypertension who presents emergency department complaining of right neck swelling.  He states he had a cold for the past several days but today started having swelling to the right side of his face with pain.  Denies fevers or chills or trouble swallowing or breathing.  He has never had this in the past.  No new medications.  Denies any itching or rash.  He denies dental pain.  HPI     Home Medications Prior to Admission medications   Medication Sig Start Date End Date Taking? Authorizing Provider  amoxicillin-clavulanate (AUGMENTIN) 875-125 MG tablet Take 1 tablet by mouth every 12 (twelve) hours. 12/12/22  Yes Sherard Sutch A, PA-C  acetaminophen (TYLENOL) 325 MG tablet Take 2 tablets (650 mg total) by mouth every 4 (four) hours as needed for mild pain (do not take more than 4,000mg  in 24 hours). Patient not taking: Reported on 05/28/2022 03/20/20   Adam Phenix, PA-C  albuterol (VENTOLIN HFA) 108 (90 Base) MCG/ACT inhaler Inhale 2 puffs into the lungs every 6 (six) hours as needed for wheezing or shortness of breath (Cough, and/or prior to all physical activity). 12/25/21   Theadora Rama Scales, PA-C  amLODipine (NORVASC) 5 MG tablet Take 1 tablet (5 mg total) by mouth daily. 08/28/22   Ivonne Andrew, NP  ibuprofen (ADVIL) 600 MG tablet Take 1 tablet (600 mg total) by mouth every 6 (six) hours as needed. 08/26/21   Merrilee Jansky, MD  lamoTRIgine (LAMICTAL) 150 MG tablet Take 150 mg by mouth daily.    [provider]  loratadine (CLARITIN) 10 MG tablet Take 1 tablet (10 mg total) by mouth daily. 08/26/21 09/25/21  Merrilee Jansky, MD  Multiple Vitamin (MULTIVITAMIN WITH MINERALS) TABS tablet Take 1 tablet by  mouth daily.    [provider]  sertraline (ZOLOFT) 50 MG tablet Take 50 mg by mouth daily. Patient not taking: Reported on 08/28/2022    [provider]  topiramate (TOPAMAX) 25 MG tablet Take 1 tablet (25 mg total) by mouth 2 (two) times daily. Patient not taking: No sig reported 06/09/18 09/02/19  Kallie Locks, FNP      Allergies    Hydrocodone, Tomato, Tomato, and Percocet [oxycodone-acetaminophen]    Review of Systems   Review of Systems  HENT:  Positive for facial swelling.     Physical Exam Updated Vital Signs BP (!) 140/88 (BP Location: Right Arm)   Pulse 95   Temp 98.5 F (36.9 C) (Oral)   Resp 18   SpO2 100%  Physical Exam Vitals and nursing note reviewed.  Constitutional:      General: He is not in acute distress.    Appearance: He is well-developed.  HENT:     Head: Normocephalic and atraumatic.     Comments: Right parotid swelling    Mouth/Throat:     Mouth: Mucous membranes are moist.  Eyes:     Conjunctiva/sclera: Conjunctivae normal.  Cardiovascular:     Rate and Rhythm: Normal rate and regular rhythm.     Heart sounds: No murmur heard. Pulmonary:     Effort: Pulmonary effort is normal. No respiratory distress.     Breath sounds: Normal breath sounds.  Abdominal:     Palpations: Abdomen is soft.     Tenderness: There is no abdominal tenderness.  Musculoskeletal:        General: No swelling.     Cervical back: Neck supple.  Skin:    General: Skin is warm and dry.     Capillary Refill: Capillary refill takes less than 2 seconds.  Neurological:     Mental Status: He is alert.  Psychiatric:        Mood and Affect: Mood normal.     ED Results / Procedures / Treatments   Labs (all labs ordered are listed, but only abnormal results are displayed) Labs Reviewed  CBC WITH DIFFERENTIAL/PLATELET - Abnormal; Notable for the following components:      Result Value   WBC 12.8 (*)    Neutro Abs 8.6 (*)    All other components  within normal limits  RESP PANEL BY RT-PCR (RSV, FLU A&B, COVID)  [id number]  BASIC METABOLIC PANEL    EKG None  Radiology CT Maxillofacial W Contrast  Result Date: 12/12/2022 CLINICAL DATA:  Right facial swelling EXAM: CT MAXILLOFACIAL WITH CONTRAST TECHNIQUE: Multidetector CT imaging of the maxillofacial structures was performed with intravenous contrast. Multiplanar CT image reconstructions were also generated. RADIATION DOSE REDUCTION: This exam was performed according to the departmental dose-optimization program which includes automated exposure control, adjustment of the mA and/or kV according to patient size and/or use of iterative reconstruction technique. CONTRAST:  107mL OMNIPAQUE IOHEXOL 300 MG/ML  SOLN COMPARISON:  None Available. FINDINGS: Osseous: No fracture or mandibular dislocation. No destructive process. Orbits: Negative. No traumatic or inflammatory finding. Sinuses: Clear. Soft tissues: No abscess or other fluid collection. No soft tissue mass. Limited intracranial: No significant or unexpected finding. IMPRESSION: No finding to correlate to the reported right facial swelling. Electronically Signed   By: Deatra Robinson M.D.   On: 12/12/2022 19:48    Procedures Procedures    Medications Ordered in ED Medications  amoxicillin-clavulanate (AUGMENTIN) 875-125 MG per tablet 1 tablet (has no administration in time range)  ketorolac (TORADOL) 15 MG/ML injection 15 mg (15 mg Intravenous Given 12/12/22 1804)  iohexol (OMNIPAQUE) 300 MG/ML solution 100 mL (75 mLs Intravenous Contrast Given 12/12/22 1902)    ED Course/ Medical Decision Making/ A&P                           Medical Decision Making This patient presents to the ED for concern of swelling to the right face, this involves an extensive number of treatment options, and is a complaint that carries with it a high risk of complications and morbidity.  The differential diagnosis includes dental abscess, parotitis, parotid  gland abscess or neoplasm, events, allergic reaction, other   Co morbidities that complicate the patient evaluation Hypertension  Additional history obtained:  Additional history obtained from EMR External records from outside source obtained and reviewed including patient visit for hypertension   Lab Tests:  I Ordered, and personally interpreted labs.  The pertinent results include: Mild leukocytosis   Imaging Studies ordered:  I ordered imaging studies including CT maxillofacial with contrast I independently visualized and interpreted imaging which showed slight increase in size to the right parotid gland with mild stranding Radiology read showed no acute abnormalities      Problem List / ED Course / Critical interventions / Medication management  Right parotitis: Patient presented to the ED for facial swelling the right.  He had swelling  to the right parotid area.  Show mild leukocytosis, otherwise reassuring given his significant pain CT was ordered to rule out underlying abscess or neoplasm.  My review shows mild increased in size with some mild stranding of the right parotid gland.  Radiology read this is normal.  Patient however clinically has parotitis on exam and we will treat him for this.  Discussed with him using sour candies to help increase salivary flow and he was put on Augmentin.  He is advised to follow close with his primary care doctor and come back to the ED if he has any new or worsening symptoms. I ordered medication including Toradol for pain Reevaluation of the patient after these medicines showed that the patient improved I have reviewed the patients home medicines and have made adjustments as needed      Amount and/or Complexity of Data Reviewed Labs: ordered. Radiology: ordered.  Risk Prescription drug management.           Final Clinical Impression(s) / ED Diagnoses Final diagnoses:  Parotitis, acute    Rx / DC Orders ED  Discharge Orders          Ordered    amoxicillin-clavulanate (AUGMENTIN) 875-125 MG tablet  Every 12 hours        12/12/22 2050              Josem Kaufmann 12/12/22 2111    Arby Barrette, MD 12/24/22 1428

## 2022-12-12 NOTE — ED Triage Notes (Signed)
Right side (lymph area) swelling under right ear. Painful. Reports some congestion, cough pain in ear when swallowing Started 2 days ago

## 2022-12-12 NOTE — ED Notes (Signed)
Patient transported to CT 

## 2022-12-18 ENCOUNTER — Telehealth: Payer: Self-pay

## 2022-12-18 NOTE — Telephone Encounter (Signed)
Transition Care Management Follow-up Telephone Call Date of discharge and from where: 12/12/22 How have you been since you were released from the hospital? Pt is doing fine  Any questions or concerns? No  Items Reviewed: Did the pt receive and understand the discharge instructions provided? Yes  Medications obtained and verified? Yes  Other? No  Any new allergies since your discharge? No  Dietary orders reviewed? No Do you have support at home? Yes   Follow up appointments reviewed:  PCP Hospital f/u appt confirmed? Yes  Scheduled to see tonya  on 12/25/22 @ 1:20 pm. [facility] f/u appt confirmed? No   Are transportation arrangements needed? No  If their condition worsens, is the pt aware to call PCP or go to the Emergency Dept.? Yes Was the patient provided with contact information for the PCP's office or ED? Yes Was to pt encouraged to call back with questions or concerns? Yes    Elyse Jarvis RMA

## 2022-12-25 ENCOUNTER — Encounter: Payer: Self-pay | Admitting: Nurse Practitioner

## 2022-12-25 ENCOUNTER — Ambulatory Visit (INDEPENDENT_AMBULATORY_CARE_PROVIDER_SITE_OTHER): Payer: Medicaid Other | Admitting: Nurse Practitioner

## 2022-12-25 VITALS — BP 117/73 | HR 78 | Temp 97.3°F | Ht 74.5 in | Wt 245.0 lb

## 2022-12-25 DIAGNOSIS — J069 Acute upper respiratory infection, unspecified: Secondary | ICD-10-CM

## 2022-12-25 MED ORDER — PREDNISONE 20 MG PO TABS: 20.0000 mg | ORAL_TABLET | Freq: Every day | ORAL | 0 refills | Status: AC

## 2022-12-25 MED ORDER — AZITHROMYCIN 250 MG PO TABS: ORAL_TABLET | ORAL | 0 refills | Status: AC

## 2022-12-25 MED ORDER — GUAIFENESIN ER 600 MG PO TB12: 1200.0000 mg | ORAL_TABLET | Freq: Two times a day (BID) | ORAL | 0 refills | Status: AC
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
## 2022-12-25 NOTE — Patient Instructions (Signed)
1. Upper respiratory tract infection, unspecified type  - predniSONE (DELTASONE) 20 MG tablet; Take 1 tablet (20 mg total) by mouth daily with breakfast for 5 days.  Dispense: 5 tablet; Refill: 0 - guaiFENesin (MUCINEX) 600 MG 12 hr tablet; Take 2 tablets (1,200 mg total) by mouth 2 (two) times daily for 7 days.  Dispense: 28 tablet; Refill: 0 - azithromycin (ZITHROMAX) 250 MG tablet; Take 2 tablets on day 1, then 1 tablet daily on days 2 through 5  Dispense: 6 tablet; Refill: 0   Follow up:  Follow up in 3 months or sooner if needed

## 2022-12-26 ENCOUNTER — Encounter: Payer: Self-pay | Admitting: Nurse Practitioner

## 2022-12-26 DIAGNOSIS — J069 Acute upper respiratory infection, unspecified: Secondary | ICD-10-CM | POA: Insufficient documentation

## 2022-12-26 NOTE — Assessment & Plan Note (Signed)
-  predniSONE (DELTASONE) 20 MG tablet; Take 1 tablet (20 mg total) by mouth daily with breakfast for 5 days.  Dispense: 5 tablet; Refill: 0 - guaiFENesin (MUCINEX) 600 MG 12 hr tablet; Take 2 tablets (1,200 mg total) by mouth 2 (two) times daily for 7 days.  Dispense: 28 tablet; Refill: 0 - azithromycin (ZITHROMAX) 250 MG tablet; Take 2 tablets on day 1, then 1 tablet daily on days 2 through 5  Dispense: 6 tablet; Refill: 0   Follow up:  Follow up in 3 months or sooner if needed

## 2023-01-15 ENCOUNTER — Other Ambulatory Visit: Payer: Self-pay

## 2023-01-15 ENCOUNTER — Encounter (HOSPITAL_BASED_OUTPATIENT_CLINIC_OR_DEPARTMENT_OTHER): Payer: Self-pay

## 2023-01-15 ENCOUNTER — Emergency Department (HOSPITAL_BASED_OUTPATIENT_CLINIC_OR_DEPARTMENT_OTHER)
Admission: EM | Admit: 2023-01-15 | Discharge: 2023-01-15 | Disposition: A | Discharge status: Home / Self Care | Payer: Medicaid Other | Attending: Emergency Medicine | Admitting: Emergency Medicine

## 2023-01-15 ENCOUNTER — Emergency Department (HOSPITAL_BASED_OUTPATIENT_CLINIC_OR_DEPARTMENT_OTHER): Payer: Medicaid Other | Admitting: Radiology

## 2023-01-15 DIAGNOSIS — J039 Acute tonsillitis, unspecified: Secondary | ICD-10-CM

## 2023-01-15 DIAGNOSIS — Z79899 Other long term (current) drug therapy: Secondary | ICD-10-CM | POA: Insufficient documentation

## 2023-01-15 DIAGNOSIS — J029 Acute pharyngitis, unspecified: Secondary | ICD-10-CM | POA: Diagnosis present

## 2023-01-15 DIAGNOSIS — I10 Essential (primary) hypertension: Secondary | ICD-10-CM | POA: Insufficient documentation

## 2023-01-15 DIAGNOSIS — Z20822 Contact with and (suspected) exposure to covid-19: Secondary | ICD-10-CM | POA: Diagnosis not present

## 2023-01-15 LAB — GROUP A STREP BY PCR: Group A Strep by PCR: NOT DETECTED

## 2023-01-15 LAB — RESP PANEL BY RT-PCR (RSV, FLU A&B, COVID)  RVPGX2
Influenza A by PCR: NEGATIVE
Influenza B by PCR: NEGATIVE
Resp Syncytial Virus by PCR: NEGATIVE
SARS Coronavirus 2 by RT PCR: NEGATIVE

## 2023-01-15 MED ORDER — LIDOCAINE VISCOUS HCL 2 % MT SOLN
15.0000 mL | Freq: Once | OROMUCOSAL | Status: AC
  Administered 2023-01-15: 15 mL via OROMUCOSAL
  Filled 2023-01-15: qty 15

## 2023-01-15 MED ORDER — DEXAMETHASONE 10 MG/ML FOR PEDIATRIC ORAL USE
10.0000 mg | Freq: Once | INTRAMUSCULAR | Status: AC
  Administered 2023-01-15: 10 mg via ORAL
  Filled 2023-01-15: qty 1

## 2023-01-15 MED ORDER — DEXAMETHASONE 1 MG/ML PO CONC
10.0000 mg | Freq: Once | ORAL | Status: DC
  Filled 2023-01-15: qty 10

## 2023-01-15 NOTE — ED Provider Notes (Signed)
[facility] Provider Note   CSN: [id number] Arrival date & time: 01/15/23  1124     History  Chief Complaint  Patient presents with   Sore Throat    Joseph Bautista is a 37 y.o. male with past medical history significant for hypertension who presents with concern for sore throat, some cough, and right ear pain since Monday.  He reports that he is coughed up some brown sputum.  He reports that he has some occasional chest pain with cough but no chest pain at rest, he denies any shortness of breath, fever, chills.  He denies any nausea, vomiting.  He reports that he overall does not feel systemically sick but has been struggling with a sore throat.  He has not tried anything for pain.   Sore Throat       Home Medications Prior to Admission medications   Medication Sig Start Date End Date Taking? Authorizing Provider  acetaminophen (TYLENOL) 325 MG tablet Take 2 tablets (650 mg total) by mouth every 4 (four) hours as needed for mild pain (do not take more than 4,000mg  in 24 hours). 03/20/20   Jill Alexanders, PA-C  albuterol (VENTOLIN HFA) 108 (90 Base) MCG/ACT inhaler Inhale 2 puffs into the lungs every 6 (six) hours as needed for wheezing or shortness of breath (Cough, and/or prior to all physical activity). 12/25/21   Lynden Oxford Scales, PA-C  amLODipine (NORVASC) 5 MG tablet Take 1 tablet (5 mg total) by mouth daily. 08/28/22   Fenton Foy, NP  amoxicillin-clavulanate (AUGMENTIN) 875-125 MG tablet Take 1 tablet by mouth every 12 (twelve) hours. Patient not taking: Reported on 12/25/2022 12/12/22   Sherrye Payor A, PA-C  ibuprofen (ADVIL) 600 MG tablet Take 1 tablet (600 mg total) by mouth every 6 (six) hours as needed. 08/26/21   Chase Picket, MD  lamoTRIgine (LAMICTAL) 150 MG tablet Take 150 mg by mouth daily.    [provider]  loratadine (CLARITIN) 10 MG tablet Take 1 tablet (10 mg total) by mouth  daily. Patient not taking: Reported on 12/25/2022 08/26/21 09/25/21  Chase Picket, MD  Multiple Vitamin (MULTIVITAMIN WITH MINERALS) TABS tablet Take 1 tablet by mouth daily.    [provider]  topiramate (TOPAMAX) 25 MG tablet Take 1 tablet (25 mg total) by mouth 2 (two) times daily. Patient not taking: No sig reported 06/09/18 09/02/19  Azzie Glatter, FNP      Allergies    Hydrocodone, Tomato, Tomato, and Percocet [oxycodone-acetaminophen]    Review of Systems   Review of Systems  HENT:  Positive for sore throat.   All other systems reviewed and are negative.   Physical Exam Updated Vital Signs BP (!) 147/100 (BP Location: Right Arm)   Pulse 93   Temp 97.9 F (36.6 C)   Resp 20   Ht 6' 2.5" (1.892 m)   Wt 111.1 kg   SpO2 97%   BMI 31.03 kg/m  Physical Exam Vitals and nursing note reviewed.  Constitutional:      General: He is not in acute distress.    Appearance: Normal appearance.  HENT:     Head: Normocephalic and atraumatic.     Right Ear: Tympanic membrane normal. Tympanic membrane is not erythematous.     Left Ear: Tympanic membrane normal. Tympanic membrane is not erythematous.     Mouth/Throat:     Comments: Mild posterior oropharynx erythema, without swelling, exudate. Uvula midline, tonsils 3+  bilaterally.  No trismus, stridor, evidence of PTA, floor of mouth swelling or redness.   Eyes:     General:        Right eye: No discharge.        Left eye: No discharge.  Cardiovascular:     Rate and Rhythm: Normal rate and regular rhythm.  Pulmonary:     Effort: Pulmonary effort is normal. No respiratory distress.  Musculoskeletal:        General: No deformity.  Skin:    General: Skin is warm and dry.  Neurological:     Mental Status: He is alert and oriented to person, place, and time.  Psychiatric:        Mood and Affect: Mood normal.        Behavior: Behavior normal.     ED Results / Procedures / Treatments   Labs (all labs ordered  are listed, but only abnormal results are displayed) Labs Reviewed  GROUP A STREP BY PCR  RESP PANEL BY RT-PCR (RSV, FLU A&B, COVID)  [id number]    EKG None  Radiology DG Chest 2 View  Result Date: 01/15/2023 CLINICAL DATA:  cough, brown sputum EXAM: CHEST - 2 VIEW COMPARISON:  11/03/2022 FINDINGS: Cardiac silhouette is unremarkable. No pneumothorax or pleural effusion. The lungs are clear. The visualized skeletal structures are unremarkable. IMPRESSION: No acute cardiopulmonary process. Electronically Signed   By: Sammie Bench M.D.   On: 01/15/2023 12:28    Procedures Procedures    Medications Ordered in ED Medications  lidocaine (XYLOCAINE) 2 % viscous mouth solution 15 mL (15 mLs Mouth/Throat Given 01/15/23 1225)  dexamethasone (DECADRON) 10 MG/ML injection for Pediatric ORAL use 10 mg (10 mg Oral Given 01/15/23 1320)    ED Course/ Medical Decision Making/ A&P                             Medical Decision Making Amount and/or Complexity of Data Reviewed Radiology: ordered.  Risk Prescription drug management.   This patient is a 37 y.o. male  who presents to the ED for concern of sore throat, right ear pain, cough with brown sputum  Differential diagnoses prior to evaluation: The emergent differential diagnosis includes, but is not limited to, viral or bacterial tonsillitis, pharyngitis, recurrent parotitis, Ludwig angina, PTA, epiglottitis, or other ENT emergency, considered pneumonia, or other respiratory infection, considered acute otitis media, otitis externa, mastoiditis. This is not an exhaustive differential.   Past Medical History / Co-morbidities: History of anxiety, hypertension  Additional history: Chart reviewed. Pertinent results include: Reviewed recent emergency department visit, patient diagnosed with parotitis, he was treated with antibiotics.  He is no longer having any neck swelling  Physical Exam: Physical exam performed. The pertinent findings include:  Patient with 3+ tonsils, posterior oropharynx erythema, without exudate, no unilateral swelling, uvula midline.  He has no stridor, he has no significant anterior neck tenderness, swelling.  He has overall normal heart and lung sounds.  Lab Tests/Imaging studies: I personally interpreted labs/imaging and the pertinent results include: Independently interpreted RVP, PCR strep which were both unremarkable, negative at this time.  I independently interpreted plain film chest x-ray which shows no acute intrathoracic abnormality.  I agree with the radiologist interpretation.   Medications: I ordered medication including viscous lidocaine, Decadron x 1 for tonsillar swelling.  I have reviewed the patients home medicines and have made adjustments as needed.   Disposition: After consideration of the diagnostic results  and the patients response to treatment, I feel that patient's symptoms are consistent with viral tonsillitis, I do not see any evidence of acute otitis media, or any ENT emergency.  Encourage follow-up with ENT considering he has been seen consecutive months now for tonsillitis, parotitis.   emergency department workup does not suggest an emergent condition requiring admission or immediate intervention beyond what has been performed at this time. The plan is: as above. The patient is safe for discharge and has been instructed to return immediately for worsening symptoms, change in symptoms or any other concerns.  Final Clinical Impression(s) / ED Diagnoses Final diagnoses:  Tonsillitis    Rx / DC Orders ED Discharge Orders     None         Dorien Chihuahua 01/15/23 1332    Blanchie Dessert, MD 01/15/23 1545

## 2023-01-15 NOTE — Discharge Instructions (Addendum)
Please use Tylenol or ibuprofen for pain.  You may use 600 mg ibuprofen every 6 hours or 1000 mg of Tylenol every 6 hours.  You may choose to alternate between the 2.  This would be most effective.  Not to exceed 4 g of Tylenol within 24 hours.  Not to exceed 3200 mg ibuprofen 24 hours.  Use tea and honey for sore throat, follow up with ENT if you continue to have recurrent sore throat, tonsillitis for discussion of tonsil removal

## 2023-01-15 NOTE — ED Triage Notes (Signed)
Patient here POV from Home.  Endorses Sore Throat that began Monday. States it has worsened since and is painful to Right Ear.  No Cough or Fever.   NAD Noted during Triage. A&Ox4. GCS 15. Ambulatory.

## 2023-01-16 ENCOUNTER — Ambulatory Visit (INDEPENDENT_AMBULATORY_CARE_PROVIDER_SITE_OTHER): Payer: Medicaid Other | Admitting: Nurse Practitioner

## 2023-01-16 ENCOUNTER — Encounter: Payer: Self-pay | Admitting: Nurse Practitioner

## 2023-01-16 VITALS — BP 124/80 | HR 104 | Ht 71.0 in | Wt 234.2 lb

## 2023-01-16 DIAGNOSIS — Z113 Encounter for screening for infections with a predominantly sexual mode of transmission: Secondary | ICD-10-CM

## 2023-01-16 DIAGNOSIS — J312 Chronic pharyngitis: Secondary | ICD-10-CM | POA: Insufficient documentation

## 2023-01-16 MED ORDER — OMEPRAZOLE 20 MG PO CPDR: 20.0000 mg | DELAYED_RELEASE_CAPSULE | Freq: Every day | ORAL | 3 refills | Status: DC

## 2023-01-16 MED ORDER — LORATADINE 10 MG PO TABS: 10.0000 mg | ORAL_TABLET | Freq: Every day | ORAL | 0 refills | Status: DC

## 2023-01-16 NOTE — Patient Instructions (Signed)
1. Chronic sore throat  - loratadine (CLARITIN) 10 MG tablet; Take 1 tablet (10 mg total) by mouth daily.  Dispense: 30 tablet; Refill: 0 - Ambulatory referral to ENT - omeprazole (PRILOSEC) 20 MG capsule; Take 1 capsule (20 mg total) by mouth daily.  Dispense: 30 capsule; Refill: 3  2. Screen for STD (sexually transmitted disease)  - RPR+HIV+GC+CT Panel   Follow up:  Follow up as needed

## 2023-01-16 NOTE — Assessment & Plan Note (Signed)
-   loratadine (CLARITIN) 10 MG tablet; Take 1 tablet (10 mg total) by mouth daily.  Dispense: 30 tablet; Refill: 0 - Ambulatory referral to ENT - omeprazole (PRILOSEC) 20 MG capsule; Take 1 capsule (20 mg total) by mouth daily.  Dispense: 30 capsule; Refill: 3  2. Screen for STD (sexually transmitted disease)  - RPR+HIV+GC+CT Panel   Follow up:  Follow up as needed
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
## 2023-01-20 ENCOUNTER — Telehealth: Payer: Self-pay

## 2023-01-20 LAB — RPR+HIV+GC+CT PANEL
Chlamydia trachomatis, NAA: NEGATIVE
HIV Screen 4th Generation wRfx: NONREACTIVE
Neisseria Gonorrhoeae by PCR: NEGATIVE
RPR Ser Ql: NONREACTIVE

## 2023-01-20 NOTE — Telephone Encounter (Signed)
Transition Care Management Follow-up Telephone Call Date of discharge and from where: 01/15/23 How have you been since you were released from the hospital? Fine per pt  Any questions or concerns? Yes  Items Reviewed: Did the pt receive and understand the discharge instructions provided? Yes  Medications obtained and verified? Yes  Other? No  Any new allergies since your discharge? No  Dietary orders reviewed? No Do you have support at home? Yes     Follow up appointments reviewed:  PCP Hospital f/u appt confirmed? Yes  Scheduled to see tonya on 01/23/23 @ 1:20 pm. [facility] f/u appt confirmed? Yes  Scheduled to see ENT .unknown date . Are transportation arrangements needed? No  If their condition worsens, is the pt aware to call PCP or go to the Emergency Dept.? Yes Was the patient provided with contact information for the PCP's office or ED? Yes Was to pt encouraged to call back with questions or concerns? Yes    Elyse Jarvis RMA

## 2023-01-23 ENCOUNTER — Encounter: Payer: Self-pay | Admitting: Nurse Practitioner

## 2023-01-23 ENCOUNTER — Ambulatory Visit (INDEPENDENT_AMBULATORY_CARE_PROVIDER_SITE_OTHER): Payer: Medicaid Other | Admitting: Nurse Practitioner

## 2023-01-23 VITALS — BP 114/78 | HR 90 | Temp 98.2°F | Ht 74.0 in | Wt 241.4 lb

## 2023-01-23 DIAGNOSIS — Z113 Encounter for screening for infections with a predominantly sexual mode of transmission: Secondary | ICD-10-CM | POA: Diagnosis not present

## 2023-01-23 DIAGNOSIS — R102 Pelvic and perineal pain: Secondary | ICD-10-CM | POA: Diagnosis not present
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
## 2023-01-23 NOTE — Patient Instructions (Addendum)
1. Screen for STD (sexually transmitted disease)  - RPR+HIV+GC+CT Panel  Follow up:  Follow up in 3 months

## 2023-01-26 ENCOUNTER — Encounter: Payer: Self-pay | Admitting: Nurse Practitioner

## 2023-01-26 DIAGNOSIS — Z113 Encounter for screening for infections with a predominantly sexual mode of transmission: Secondary | ICD-10-CM | POA: Insufficient documentation

## 2023-01-26 NOTE — Assessment & Plan Note (Signed)
-   RPR+HIV+GC+CT Panel  Follow up:  Follow up in 3 months

## 2023-01-29 LAB — RPR+HIV+GC+CT PANEL
Chlamydia trachomatis, NAA: NEGATIVE
HIV Screen 4th Generation wRfx: NONREACTIVE
Neisseria Gonorrhoeae by PCR: NEGATIVE
RPR Ser Ql: NONREACTIVE

## 2023-01-30 ENCOUNTER — Other Ambulatory Visit: Payer: Self-pay

## 2023-01-30 ENCOUNTER — Telehealth: Payer: Self-pay

## 2023-01-30 DIAGNOSIS — J312 Chronic pharyngitis: Secondary | ICD-10-CM

## 2023-01-30 MED ORDER — LORATADINE 10 MG PO TABS: 10.0000 mg | ORAL_TABLET | Freq: Every day | ORAL | 0 refills | Status: DC

## 2023-01-30 NOTE — Telephone Encounter (Signed)
I left a message for the patient to return my call.

## 2023-01-30 NOTE — Telephone Encounter (Signed)
-----   Message from Fenton Foy, NP sent at 01/29/2023  4:14 PM EST ----- Std screen negative

## 2023-02-26 ENCOUNTER — Ambulatory Visit: Payer: Self-pay | Admitting: Nurse Practitioner

## 2023-03-27 ENCOUNTER — Ambulatory Visit (INDEPENDENT_AMBULATORY_CARE_PROVIDER_SITE_OTHER): Payer: Medicaid Other | Admitting: Nurse Practitioner

## 2023-03-27 ENCOUNTER — Encounter: Payer: Self-pay | Admitting: Nurse Practitioner

## 2023-03-27 VITALS — BP 130/87 | HR 95 | Temp 97.2°F | Wt 244.2 lb

## 2023-03-27 DIAGNOSIS — J312 Chronic pharyngitis: Secondary | ICD-10-CM | POA: Diagnosis not present

## 2023-03-27 DIAGNOSIS — I10 Essential (primary) hypertension: Secondary | ICD-10-CM | POA: Diagnosis not present

## 2023-03-27 DIAGNOSIS — Z13220 Encounter for screening for lipoid disorders: Secondary | ICD-10-CM

## 2023-03-27 DIAGNOSIS — Z113 Encounter for screening for infections with a predominantly sexual mode of transmission: Secondary | ICD-10-CM | POA: Diagnosis not present

## 2023-03-27 MED ORDER — LORATADINE 10 MG PO TABS: 10.0000 mg | ORAL_TABLET | Freq: Every day | ORAL | 0 refills | Status: DC

## 2023-03-27 NOTE — Patient Instructions (Addendum)
1. Lipid screening  - Lipid Panel   2. Essential hypertension  - CBC - Comprehensive metabolic panel    3. Screen for STD (sexually transmitted disease)  - RPR+HIV+GC+CT Panel - Chlamydia/Gonococcus/Trichomonas, NAA    Follow up:  Follow up in 6 months
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
## 2023-03-27 NOTE — Assessment & Plan Note (Signed)
-   Lipid Panel  2. Essential hypertension  - CBC - Comprehensive metabolic panel  3. Screen for STD (sexually transmitted disease)  - RPR+HIV+GC+CT Panel - Chlamydia/Gonococcus/Trichomonas, NAA  Follow up:  Follow up in 6 months

## 2023-03-28 LAB — CBC
Hematocrit: 45.9 % (ref 37.5–51.0)
Hemoglobin: 15.3 g/dL (ref 13.0–17.7)
MCH: 31.1 pg (ref 26.6–33.0)
MCHC: 33.3 g/dL (ref 31.5–35.7)
MCV: 93 fL (ref 79–97)
Platelets: 226 10*3/uL (ref 150–450)
RBC: 4.92 x10E6/uL (ref 4.14–5.80)
RDW: 13.2 % (ref 11.6–15.4)
WBC: 7.2 10*3/uL (ref 3.4–10.8)

## 2023-03-28 LAB — COMPREHENSIVE METABOLIC PANEL
ALT: 36 IU/L (ref 0–44)
AST: 22 IU/L (ref 0–40)
Albumin/Globulin Ratio: 1.8 (ref 1.2–2.2)
Albumin: 4.9 g/dL (ref 4.1–5.1)
Alkaline Phosphatase: 104 IU/L (ref 44–121)
BUN/Creatinine Ratio: 11 (ref 9–20)
BUN: 10 mg/dL (ref 6–20)
Bilirubin Total: 0.3 mg/dL (ref 0.0–1.2)
CO2: 23 mmol/L (ref 20–29)
Calcium: 9.5 mg/dL (ref 8.7–10.2)
Chloride: 102 mmol/L (ref 96–106)
Creatinine, Ser: 0.9 mg/dL (ref 0.76–1.27)
Globulin, Total: 2.7 g/dL (ref 1.5–4.5)
Glucose: 89 mg/dL (ref 70–99)
Potassium: 3.6 mmol/L (ref 3.5–5.2)
Sodium: 143 mmol/L (ref 134–144)
Total Protein: 7.6 g/dL (ref 6.0–8.5)
eGFR: 112 mL/min/{1.73_m2} (ref >59)

## 2023-03-28 LAB — LIPID PANEL
Chol/HDL Ratio: 4.3 ratio (ref 0.0–5.0)
Cholesterol, Total: 196 mg/dL (ref 100–199)
HDL: 46 mg/dL (ref >39)
LDL Chol Calc (NIH): 123 mg/dL — ABNORMAL HIGH (ref 0–99)
Triglycerides: 152 mg/dL — ABNORMAL HIGH (ref 0–149)
VLDL Cholesterol Cal: 27 mg/dL (ref 5–40)

## 2023-03-30 LAB — CHLAMYDIA/GONOCOCCUS/TRICHOMONAS, NAA
Chlamydia by NAA: NEGATIVE
Gonococcus by NAA: NEGATIVE
Trich vag by NAA: NEGATIVE

## 2023-03-30 LAB — RPR+HIV+GC+CT PANEL
Chlamydia trachomatis, NAA: NEGATIVE
HIV Screen 4th Generation wRfx: NONREACTIVE
Neisseria Gonorrhoeae by PCR: NEGATIVE
RPR Ser Ql: NONREACTIVE

## 2023-03-30 NOTE — Progress Notes (Signed)
Pt seen in my chart. Gh 

## 2023-04-03 ENCOUNTER — Emergency Department (HOSPITAL_BASED_OUTPATIENT_CLINIC_OR_DEPARTMENT_OTHER)
Admission: EM | Admit: 2023-04-03 | Discharge: 2023-04-03 | Disposition: A | Discharge status: Home / Self Care | Payer: Medicaid Other | Attending: Emergency Medicine | Admitting: Emergency Medicine

## 2023-04-03 ENCOUNTER — Other Ambulatory Visit: Payer: Self-pay

## 2023-04-03 ENCOUNTER — Encounter (HOSPITAL_BASED_OUTPATIENT_CLINIC_OR_DEPARTMENT_OTHER): Payer: Self-pay | Admitting: Emergency Medicine

## 2023-04-03 DIAGNOSIS — Z79899 Other long term (current) drug therapy: Secondary | ICD-10-CM | POA: Insufficient documentation

## 2023-04-03 DIAGNOSIS — M6282 Rhabdomyolysis: Secondary | ICD-10-CM | POA: Diagnosis not present

## 2023-04-03 DIAGNOSIS — G5622 Lesion of ulnar nerve, left upper limb: Secondary | ICD-10-CM | POA: Insufficient documentation

## 2023-04-03 DIAGNOSIS — M791 Myalgia, unspecified site: Secondary | ICD-10-CM

## 2023-04-03 DIAGNOSIS — R202 Paresthesia of skin: Secondary | ICD-10-CM | POA: Diagnosis present

## 2023-04-03 DIAGNOSIS — I10 Essential (primary) hypertension: Secondary | ICD-10-CM | POA: Diagnosis not present

## 2023-04-03 DIAGNOSIS — R748 Abnormal levels of other serum enzymes: Secondary | ICD-10-CM | POA: Insufficient documentation

## 2023-04-03 LAB — COMPREHENSIVE METABOLIC PANEL
ALT: 27 U/L (ref 0–44)
AST: 23 U/L (ref 15–41)
Albumin: 4.7 g/dL (ref 3.5–5.0)
Alkaline Phosphatase: 73 U/L (ref 38–126)
Anion gap: 9 (ref 5–15)
BUN: 14 mg/dL (ref 6–20)
CO2: 27 mmol/L (ref 22–32)
Calcium: 9.9 mg/dL (ref 8.9–10.3)
Chloride: 102 mmol/L (ref 98–111)
Creatinine, Ser: 0.81 mg/dL (ref 0.61–1.24)
GFR, Estimated: >60 mL/min (ref >60)
Glucose, Bld: 99 mg/dL (ref 70–99)
Potassium: 3.9 mmol/L (ref 3.5–5.1)
Sodium: 138 mmol/L (ref 135–145)
Total Bilirubin: 1.3 mg/dL — ABNORMAL HIGH (ref 0.3–1.2)
Total Protein: 7.6 g/dL (ref 6.5–8.1)

## 2023-04-03 LAB — CBC WITH DIFFERENTIAL/PLATELET
Abs Immature Granulocytes: 0.03 10*3/uL (ref 0.00–0.07)
Basophils Absolute: 0 10*3/uL (ref 0.0–0.1)
Basophils Relative: 0 %
Eosinophils Absolute: 0.1 10*3/uL (ref 0.0–0.5)
Eosinophils Relative: 1 %
HCT: 44.4 % (ref 39.0–52.0)
Hemoglobin: 15.3 g/dL (ref 13.0–17.0)
Immature Granulocytes: 0 %
Lymphocytes Relative: 26 %
Lymphs Abs: 2.1 10*3/uL (ref 0.7–4.0)
MCH: 31.5 pg (ref 26.0–34.0)
MCHC: 34.5 g/dL (ref 30.0–36.0)
MCV: 91.5 fL (ref 80.0–100.0)
Monocytes Absolute: 0.4 10*3/uL (ref 0.1–1.0)
Monocytes Relative: 5 %
Neutro Abs: 5.4 10*3/uL (ref 1.7–7.7)
Neutrophils Relative %: 68 %
Platelets: 233 10*3/uL (ref 150–400)
RBC: 4.85 MIL/uL (ref 4.22–5.81)
RDW: 13 % (ref 11.5–15.5)
WBC: 8 10*3/uL (ref 4.0–10.5)
nRBC: 0 % (ref 0.0–0.2)

## 2023-04-03 LAB — MAGNESIUM: Magnesium: 2.1 mg/dL (ref 1.7–2.4)

## 2023-04-03 LAB — SEDIMENTATION RATE: Sed Rate: 1 mm/hr (ref 0–16)

## 2023-04-03 LAB — CK: Total CK: 648 U/L — ABNORMAL HIGH (ref 49–397)

## 2023-04-03 LAB — CBG MONITORING, ED: Glucose-Capillary: 84 mg/dL (ref 70–99)

## 2023-04-03 LAB — PHOSPHORUS: Phosphorus: 3.5 mg/dL (ref 2.5–4.6)

## 2023-04-03 MED ORDER — SODIUM CHLORIDE 0.9 % IV BOLUS
500.0000 mL | Freq: Once | INTRAVENOUS | Status: AC
  Administered 2023-04-03: 500 mL via INTRAVENOUS

## 2023-04-03 MED ORDER — SODIUM CHLORIDE 0.9 % IV BOLUS
1000.0000 mL | Freq: Once | INTRAVENOUS | Status: AC
  Administered 2023-04-03: 1000 mL via INTRAVENOUS

## 2023-04-03 NOTE — ED Notes (Signed)
Pt reports sharp shooting pain intermittent in different areas of his body.

## 2023-04-03 NOTE — ED Notes (Signed)
2 green and 1 lavender tubes sent to lab; awaiting orders.

## 2023-04-03 NOTE — ED Notes (Signed)
Pt was given gingerale Resting

## 2023-04-03 NOTE — Discharge Instructions (Signed)
1.  You have a mild elevation in muscle enzyme labs likely due to rhabdomyolysis.  At this time symptoms are very mild.  This is causing your muscle aches.  Often this occurs due to overexertion of the muscles, working out in the heat, an episode of dehydration.  At this time, you need to stay well-hydrated, avoid exertion in the heat for the next couple of days and follow-up with your doctor in 2 to 3 days for recheck. 2.  The tingling you experience in your left hand is likely due to compression on the nerve that goes to the hand and the fingers in that area.  This time symptoms are nonemergent but you should get referred to orthopedics for further evaluation. 3.  Return to the emergency department immediately if you have new or worsening symptoms especially worsening muscle pain, general weakness, vomiting or fever or other concerning changes.

## 2023-04-03 NOTE — ED Triage Notes (Signed)
Pt via pov from home with tingling in his left hand since Tuesday. Pt reports that he called an ambulance for tingling and nausea on Wednesday and they told him his vitals were fine and to come to hospital if he felt like he needed to. Pt also reports "shooting pain all over my body" and occasional "weird headaches.:" Pt alert & oriented x 4, nad noted. Pt's speech, balance appear WNL during triage.
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
## 2023-04-06 ENCOUNTER — Telehealth: Payer: Self-pay

## 2023-04-06 NOTE — Transitions of Care (Post Inpatient/ED Visit) (Cosign Needed)
   04/06/2023  Name: Joseph Bautista MRN: [id number] DOB: 27-Jun-1986  Today's TOC FU Call Status: Today's TOC FU Call Status:: Successful TOC FU Call Competed TOC FU Call Complete Date: 04/06/23  Transition Care Management Follow-up Telephone Call Date of Discharge: 04/03/23 Discharge Facility: [facility] ([facility]) Type of Discharge: Emergency Department Reason for ED Visit: Neurologic Neurologic Diagnosis:  (TINGLING) How have you been since you were released from the hospital?: Better Any questions or concerns?: No  Items Reviewed: Did you receive and understand the discharge instructions provided?: Yes Medications obtained and verified?: Yes (Medications Reviewed) Any new allergies since your discharge?: No Dietary orders reviewed?: NA Do you have support at home?: Yes People in Home: other relative(s)  Home Care and Equipment/Supplies: Were Home Health Services Ordered?: NA Any new equipment or medical supplies ordered?: NA  Functional Questionnaire: Do you need assistance with bathing/showering or dressing?: No Do you need assistance with meal preparation?: No Do you need assistance with eating?: No Do you have difficulty maintaining continence: No Do you need assistance with getting out of bed/getting out of a chair/moving?: No Do you have difficulty managing or taking your medications?: No  Follow up appointments reviewed: PCP Follow-up appointment confirmed?: Yes Date of PCP follow-up appointment?: 04/20/23 Follow-up Provider: [facility] Follow-up appointment confirmed?: NA Do you need transportation to your follow-up appointment?: No Do you understand care options if your condition(s) worsen?: Yes-patient verbalized understanding  SDOH Interventions Today    Flowsheet Row Most Recent Value  SDOH Interventions   Housing Interventions Intervention Not Indicated       SIGNATURE   Renelda Loma RMA

## 2023-04-20 ENCOUNTER — Encounter: Payer: Self-pay | Admitting: Nurse Practitioner

## 2023-04-20 ENCOUNTER — Ambulatory Visit (INDEPENDENT_AMBULATORY_CARE_PROVIDER_SITE_OTHER): Payer: Medicaid Other | Admitting: Nurse Practitioner

## 2023-04-20 VITALS — BP 135/75 | HR 77 | Temp 97.8°F | Wt 254.4 lb

## 2023-04-20 DIAGNOSIS — R200 Anesthesia of skin: Secondary | ICD-10-CM | POA: Diagnosis not present

## 2023-04-20 DIAGNOSIS — R202 Paresthesia of skin: Secondary | ICD-10-CM

## 2023-04-20 DIAGNOSIS — R079 Chest pain, unspecified: Secondary | ICD-10-CM | POA: Diagnosis not present

## 2023-04-20 DIAGNOSIS — R17 Unspecified jaundice: Secondary | ICD-10-CM

## 2023-04-20 MED ORDER — ALBUTEROL SULFATE HFA 108 (90 BASE) MCG/ACT IN AERS: 2.0000 | INHALATION_SPRAY | Freq: Four times a day (QID) | RESPIRATORY_TRACT | 2 refills | Status: DC | PRN

## 2023-04-20 NOTE — Assessment & Plan Note (Signed)
-   Comprehensive metabolic panel  2. Numbness and tingling in left arm  -resolved - will call if symptoms return  3. Intermittent chest pain  -resolved - will call if symptoms return  Follow up:  Follow up in 6 months

## 2023-04-20 NOTE — Patient Instructions (Signed)
1. Elevated bilirubin  - Comprehensive metabolic panel  2. Numbness and tingling in left arm  -resolved - will call if symptoms return  3. Intermittent chest pain  -resolved - will call if symptoms return  Follow up:  Follow up in 6 months
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
## 2023-04-21 LAB — COMPREHENSIVE METABOLIC PANEL
ALT: 63 IU/L — ABNORMAL HIGH (ref 0–44)
AST: 29 IU/L (ref 0–40)
Albumin/Globulin Ratio: 1.8 (ref 1.2–2.2)
Albumin: 4.4 g/dL (ref 4.1–5.1)
Alkaline Phosphatase: 111 IU/L (ref 44–121)
BUN/Creatinine Ratio: 17 (ref 9–20)
BUN: 22 mg/dL — ABNORMAL HIGH (ref 6–20)
Bilirubin Total: 0.4 mg/dL (ref 0.0–1.2)
CO2: 23 mmol/L (ref 20–29)
Calcium: 9.4 mg/dL (ref 8.7–10.2)
Chloride: 101 mmol/L (ref 96–106)
Creatinine, Ser: 1.26 mg/dL (ref 0.76–1.27)
Globulin, Total: 2.5 g/dL (ref 1.5–4.5)
Glucose: 85 mg/dL (ref 70–99)
Potassium: 4.7 mmol/L (ref 3.5–5.2)
Sodium: 140 mmol/L (ref 134–144)
Total Protein: 6.9 g/dL (ref 6.0–8.5)
eGFR: 75 mL/min/{1.73_m2} (ref >59)

## 2023-05-18 ENCOUNTER — Other Ambulatory Visit: Payer: Self-pay

## 2023-05-18 ENCOUNTER — Other Ambulatory Visit: Payer: Self-pay | Admitting: Nurse Practitioner

## 2023-05-18 DIAGNOSIS — J312 Chronic pharyngitis: Secondary | ICD-10-CM

## 2023-05-18 MED ORDER — OMEPRAZOLE 20 MG PO CPDR
20.0000 mg | DELAYED_RELEASE_CAPSULE | Freq: Every day | ORAL | 3 refills | Status: DC
Start: 2023-05-18 — End: 2024-02-26

## 2023-05-27 ENCOUNTER — Encounter (HOSPITAL_BASED_OUTPATIENT_CLINIC_OR_DEPARTMENT_OTHER): Payer: Self-pay | Admitting: *Deleted

## 2023-05-27 ENCOUNTER — Emergency Department (HOSPITAL_BASED_OUTPATIENT_CLINIC_OR_DEPARTMENT_OTHER): Payer: Medicaid Other

## 2023-05-27 ENCOUNTER — Emergency Department (HOSPITAL_BASED_OUTPATIENT_CLINIC_OR_DEPARTMENT_OTHER)
Admission: EM | Admit: 2023-05-27 | Discharge: 2023-05-27 | Disposition: A | Discharge status: Home / Self Care | Payer: Medicaid Other | Attending: Emergency Medicine | Admitting: Emergency Medicine

## 2023-05-27 ENCOUNTER — Other Ambulatory Visit: Payer: Self-pay

## 2023-05-27 DIAGNOSIS — S61213A Laceration without foreign body of left middle finger without damage to nail, initial encounter: Secondary | ICD-10-CM | POA: Diagnosis not present

## 2023-05-27 DIAGNOSIS — W232XXA Caught, crushed, jammed or pinched between a moving and stationary object, initial encounter: Secondary | ICD-10-CM | POA: Insufficient documentation

## 2023-05-27 DIAGNOSIS — S6992XA Unspecified injury of left wrist, hand and finger(s), initial encounter: Secondary | ICD-10-CM | POA: Diagnosis present

## 2023-05-27 MED ORDER — HYDROCODONE-ACETAMINOPHEN 5-325 MG PO TABS
1.0000 | ORAL_TABLET | Freq: Once | ORAL | Status: AC
  Administered 2023-05-27: 1 via ORAL
  Filled 2023-05-27: qty 1

## 2023-05-27 MED ORDER — LIDOCAINE-EPINEPHRINE (PF) 2 %-1:200000 IJ SOLN
10.0000 mL | Freq: Once | INTRAMUSCULAR | Status: AC
  Administered 2023-05-27: 10 mL
  Filled 2023-05-27: qty 20

## 2023-05-27 NOTE — ED Triage Notes (Signed)
Pt slammed his finger in a car door.  Dressing in place.  Pt reports lac to tip left middle finger from this crush injury.  Last tetanus shot 2 years ago.  Pt has dressing in place, bleeding controlled.

## 2023-05-27 NOTE — Discharge Instructions (Addendum)
Thank you for allowing me to be a part of your care today.   Your laceration was repaired using non-dissolving sutures.  You will need to return to the ED or an urgent care in 7-10 days to have these removed.    Please monitor your wound for signs of infection including redness, swelling, increased warmth, or discharge from the site.  If you develop infection, please seek medical attention.   Keep your wound clean and dry tonight.  You may resume normal bathing tomorrow.  Remember to always pat your wound dry, do not rub.  I recommend keeping it covered with antibiotic ointment and a band-aid to help prevent infection.    I recommend taking 600-800 mg of ibuprofen every 6-8 hours as needed for pain.

## 2023-05-27 NOTE — ED Notes (Signed)
Reviewed AVS/discharge instruction with patient. Time allotted for and all questions answered. Patient is agreeable for d/c and escorted to ed exit by staff.  

## 2023-05-27 NOTE — ED Provider Notes (Signed)
[facility] DEPARTMENT AT [facility] Provider Note   CSN: [id number] Arrival date & time: 05/27/23  2058     History  Chief Complaint  Patient presents with   Finger Injury    Joseph Bautista is a 37 y.o. male presents to the ED complaining of a laceration to his left middle finger after accidentally shutting his finger in a car door.  Patient states his last tetanus shot was 2 years prior.  Patient was able to control the bleeding at home and has a dressing in place.  Denies numbness or tingling to the finger.  He has normal range of motion.  Denies blood thinner use.     Home Medications Prior to Admission medications   Medication Sig Start Date End Date Taking? Authorizing Provider  acetaminophen (TYLENOL) 325 MG tablet Take 2 tablets (650 mg total) by mouth every 4 (four) hours as needed for mild pain (do not take more than 4,000mg  in 24 hours). 03/20/20   Adam Phenix, PA-C  albuterol (VENTOLIN HFA) 108 (90 Base) MCG/ACT inhaler Inhale 2 puffs into the lungs every 6 (six) hours as needed for wheezing or shortness of breath (Cough, and/or prior to all physical activity). 04/20/23   Ivonne Andrew, NP  amLODipine (NORVASC) 5 MG tablet Take 1 tablet (5 mg total) by mouth daily. 08/28/22   Ivonne Andrew, NP  ibuprofen (ADVIL) 600 MG tablet Take 1 tablet (600 mg total) by mouth every 6 (six) hours as needed. 08/26/21   Merrilee Jansky, MD  lamoTRIgine (LAMICTAL) 150 MG tablet Take 150 mg by mouth daily.    [provider]  loratadine (CLARITIN) 10 MG tablet Take 1 tablet (10 mg total) by mouth daily. 03/27/23 06/25/23  Ivonne Andrew, NP  Multiple Vitamin (MULTIVITAMIN WITH MINERALS) TABS tablet Take 1 tablet by mouth daily.    [provider]  omeprazole (PRILOSEC) 20 MG capsule Take 1 capsule (20 mg total) by mouth daily. 05/18/23   Ivonne Andrew, NP  topiramate (TOPAMAX) 25 MG tablet Take 1 tablet (25 mg total) by mouth 2 (two)  times daily. 06/09/18 09/02/19  Kallie Locks, FNP      Allergies    Hydrocodone, Tomato, Tomato, and Percocet [oxycodone-acetaminophen]    Review of Systems   Review of Systems  Physical Exam Updated Vital Signs BP (!) 165/118 (BP Location: Right Arm)   Pulse 92   Temp 98 F (36.7 C) (Oral)   Resp 14   SpO2 97%  Physical Exam Vitals and nursing note reviewed.  Constitutional:      General: He is not in acute distress.    Appearance: Normal appearance. He is not ill-appearing or diaphoretic.  Cardiovascular:     Rate and Rhythm: Normal rate and regular rhythm.  Pulmonary:     Effort: Pulmonary effort is normal.  Musculoskeletal:     Left hand: Laceration (distal tip of middle finger) and tenderness (over laceration) present. No swelling, deformity or bony tenderness. Normal range of motion. Normal sensation. There is no disruption of two-point discrimination. Normal capillary refill. Normal pulse.  Neurological:     Mental Status: He is alert. Mental status is at baseline.  Psychiatric:        Mood and Affect: Mood normal.        Behavior: Behavior normal.     ED Results / Procedures / Treatments   Labs (all labs ordered are listed, but only abnormal results are displayed) Labs  Reviewed - No data to display  EKG None  Radiology DG Finger Middle Left  Result Date: 05/27/2023 CLINICAL DATA:  Status post trauma. EXAM: LEFT MIDDLE FINGER 2+V COMPARISON:  None Available. FINDINGS: There is no evidence of an acute fracture or dislocation. There is no evidence of arthropathy or other focal bone abnormality. Soft tissues are unremarkable. IMPRESSION: Negative. Electronically Signed   By: Aram Candela M.D.   On: 05/27/2023 21:22    Procedures .Marland KitchenLaceration Repair  Date/Time: 05/27/2023 11:23 PM  Performed by: Lenard Simmer, PA-C Authorized by: Lenard Simmer, PA-C   Consent:    Consent obtained:  Verbal   Consent given by:  Patient   Risks, benefits, and  alternatives were discussed: yes     Risks discussed:  Infection, pain, poor wound healing, poor cosmetic result and need for additional repair   Alternatives discussed:  No treatment Universal protocol:    Procedure explained and questions answered to patient or proxy's satisfaction: yes     Patient identity confirmed:  Verbally with patient and arm band Anesthesia:    Anesthesia method:  Nerve block   Block location:  L middle finger   Block needle gauge:  25 G   Block anesthetic:  Lidocaine 2% WITH epi   Block technique:  3 sided   Block injection procedure:  Anatomic landmarks identified, introduced needle, incremental injection, anatomic landmarks palpated and negative aspiration for blood   Block outcome:  Anesthesia achieved Laceration details:    Location:  Finger   Finger location:  L long finger   Length (cm):  1 Exploration:    Hemostasis achieved with:  Direct pressure   Imaging obtained: x-ray     Imaging outcome: foreign body not noted     Wound exploration: wound explored through full range of motion and entire depth of wound visualized   Treatment:    Area cleansed with:  Saline   Amount of cleaning:  Standard   Irrigation solution:  Sterile water   Irrigation volume:  20 mL   Irrigation method:  Pressure wash   Visualized foreign bodies/material removed: no   Skin repair:    Repair method:  Sutures   Suture size:  5-0   Suture material:  Prolene   Suture technique:  Simple interrupted   Number of sutures:  6 Approximation:    Approximation:  Close Repair type:    Repair type:  Simple Post-procedure details:    Dressing:  Antibiotic ointment and bulky dressing   Procedure completion:  Tolerated well, no immediate complications     Medications Ordered in ED Medications  lidocaine-EPINEPHrine (XYLOCAINE W/EPI) 2 %-1:200000 (PF) injection 10 mL (10 mLs Infiltration Given 05/27/23 2207)  HYDROcodone-acetaminophen (NORCO/VICODIN) 5-325 MG per tablet 1 tablet  (1 tablet Oral Given 05/27/23 2206)    ED Course/ Medical Decision Making/ A&P                             Medical Decision Making Amount and/or Complexity of Data Reviewed Radiology: ordered.  Risk Prescription drug management.   This patient presents to the ED with chief complaint(s) of left middle finger injury.  The complaint involves an extensive differential diagnosis and also carries with it a high risk of complications and morbidity.    The differential diagnosis includes acute crush injury, acute fracture or dislocation of the left middle finger, laceration, abrasion, contusion, musculoskeletal strain  The initial plan is to  obtain x-ray of the left middle finger  Initial Assessment:   Exam significant for approximately 1 cm laceration to the distal end of the left middle finger.  No nail involvement.  Bleeding is well-controlled.  Capillary refill is less than 2 seconds.  Normal ROM of finger with sensation intact.  Independent visualization and interpretation of imaging: I independently visualized the following imaging with scope of interpretation limited to determining acute life threatening conditions related to emergency care: Left middle finger x-ray, which revealed no evidence of acute bony injury or dislocation.  Treatment and Reassessment: Patient's finger laceration was repaired.  See procedure section for more detail.  He was also given single dose of oral pain medicine with improvement in his pain symptoms.  Disposition:   Discussed wound care with patient.  Advised patient that he will need to return to ED or urgent care in 7 to 10 days to have sutures removed.  Discussed with patient to monitor for signs of infection and should any of these develop he will need to have his finger reevaluated.  Patient verbalized his understanding.  Work note provided.    The patient has been appropriately medically screened and/or stabilized in the ED. I have low suspicion for  any other emergent medical condition which would require further screening, evaluation or treatment in the ED or require inpatient management. At time of discharge the patient is hemodynamically stable and in no acute distress. I have discussed work-up results and diagnosis with patient and answered all questions. Patient is agreeable with discharge plan. We discussed strict return precautions for returning to the emergency department and they verbalized understanding.            Final Clinical Impression(s) / ED Diagnoses Final diagnoses:  Laceration of left middle finger without foreign body without damage to nail, initial encounter    Rx / DC Orders ED Discharge Orders     None         Lenard Simmer, PA-C 05/27/23 2325    Maia Plan, MD 05/30/23 [id number]

## 2023-05-28 ENCOUNTER — Telehealth: Payer: Self-pay

## 2023-05-28 NOTE — Transitions of Care (Post Inpatient/ED Visit) (Signed)
   05/28/2023  Name: CHUKWUKA FESTA MRN: [id number] DOB: 1986-04-22  Today's TOC FU Call Status: Today's TOC FU Call Status:: Unsuccessul Call (1st Attempt) Unsuccessful Call (1st Attempt) Date: 05/28/23  Attempted to reach the patient regarding the most recent Inpatient/ED visit.  Follow Up Plan: Additional outreach attempts will be made to reach the patient to complete the Transitions of Care (Post Inpatient/ED visit) call.   Signature Renelda Loma RMA

## 2023-05-29 ENCOUNTER — Telehealth: Payer: Self-pay

## 2023-05-29 NOTE — Transitions of Care (Post Inpatient/ED Visit) (Cosign Needed)
05/29/2023  Name: Joseph Bautista MRN: [id number] DOB: 1986/07/14  Today's TOC FU Call Status: Today's TOC FU Call Status:: Successful TOC FU Call Competed TOC FU Call Complete Date: 05/29/23  Transition Care Management Follow-up Telephone Call Discharge Facility: [facility] ([facility] Type of Discharge: Emergency Department Reason for ED Visit: Other: (cut left middle finger) How have you been since you were released from the hospital?: Better Any questions or concerns?: No  Items Reviewed: Did you receive and understand the discharge instructions provided?: Yes Medications obtained,verified, and reconciled?: Yes (Medications Reviewed) Any new allergies since your discharge?: No Dietary orders reviewed?: NA Do you have support at home?: Yes People in Home: other relative(s)  Medications Reviewed Today: Medications Reviewed Today     Reviewed by Renelda Loma, RMA (Registered Medical Assistant) on 05/29/23 at 1604  Med List Status: <None>   Medication Order Taking? Sig Documenting Provider Last Dose Status Informant  acetaminophen (TYLENOL) 325 MG tablet [id number] Yes Take 2 tablets (650 mg total) by mouth every 4 (four) hours as needed for mild pain (do not take more than 4,000mg  in 24 hours). Adam Phenix, PA-C Taking Active            Med Note Kallie Locks   Mon May 07, 2020 11:11 AM) As needed.   albuterol (VENTOLIN HFA) 108 (90 Base) MCG/ACT inhaler [id number] Yes Inhale 2 puffs into the lungs every 6 (six) hours as needed for wheezing or shortness of breath (Cough, and/or prior to all physical activity). Ivonne Andrew, NP Taking Active   amLODipine (NORVASC) 5 MG tablet [id number] Yes Take 1 tablet (5 mg total) by mouth daily. Ivonne Andrew, NP Taking Active   ibuprofen (ADVIL) 600 MG tablet [id number] Yes Take 1 tablet (600 mg total) by mouth every 6 (six) hours as needed. Merrilee Jansky, MD Taking Active            Med Note Renelda Loma    Thu Dec 25, 2022  2:15 PM) Prn   lamoTRIgine (LAMICTAL) 150 MG tablet [id number] Yes Take 150 mg by mouth daily. [provider] Taking Active Self  loratadine (CLARITIN) 10 MG tablet [id number] Yes Take 1 tablet (10 mg total) by mouth daily. Ivonne Andrew, NP Taking Active   Multiple Vitamin (MULTIVITAMIN WITH MINERALS) TABS tablet [id number] Yes Take 1 tablet by mouth daily. [provider] Taking Active Self  omeprazole (PRILOSEC) 20 MG capsule [id number] Yes Take 1 capsule (20 mg total) by mouth daily. Ivonne Andrew, NP Taking Active     Discontinued 09/02/19 1112          Med Note Kallie Locks   Fri May 06, 2019 11:15 AM) As needed.             Home Care and Equipment/Supplies: Were Home Health Services Ordered?: NA Any new equipment or medical supplies ordered?: NA  Functional Questionnaire: Do you need assistance with bathing/showering or dressing?: No Do you need assistance with meal preparation?: No Do you need assistance with eating?: No Do you have difficulty maintaining continence: No Do you need assistance with getting out of bed/getting out of a chair/moving?: No Do you have difficulty managing or taking your medications?: No  Follow up appointments reviewed: PCP Follow-up appointment confirmed?: Yes Date of PCP follow-up appointment?: 06/05/23 Follow-up Provider: Angus Seller Specialist [facility] Follow-up appointment confirmed?: NA Do you need transportation to your follow-up appointment?: No Do you understand care options if your condition(s) worsen?:  Yes-patient verbalized understanding  SDOH Interventions Today    Flowsheet Row Most Recent Value  SDOH Interventions   Transportation Interventions Intervention Not Indicated  Utilities Interventions Intervention Not Indicated  Financial Strain Interventions Intervention Not Indicated  Physical Activity Interventions Intervention Not Indicated  Stress Interventions Intervention  Not Indicated  Social Connections Interventions Intervention Not Indicated       SIGNATURE Renelda Loma RMA

## 2023-06-05 ENCOUNTER — Encounter: Payer: Self-pay | Admitting: Nurse Practitioner

## 2023-06-05 ENCOUNTER — Ambulatory Visit (INDEPENDENT_AMBULATORY_CARE_PROVIDER_SITE_OTHER): Payer: Medicaid Other | Admitting: Nurse Practitioner

## 2023-06-05 VITALS — BP 137/76 | HR 86 | Wt 243.0 lb

## 2023-06-05 DIAGNOSIS — S61213A Laceration without foreign body of left middle finger without damage to nail, initial encounter: Secondary | ICD-10-CM | POA: Insufficient documentation

## 2023-06-05 DIAGNOSIS — S61213D Laceration without foreign body of left middle finger without damage to nail, subsequent encounter: Secondary | ICD-10-CM

## 2023-06-05 MED ORDER — CEPHALEXIN 500 MG PO CAPS
500.0000 mg | ORAL_CAPSULE | Freq: Two times a day (BID) | ORAL | 0 refills | Status: AC
Start: 2023-06-05 — End: 2023-06-12

## 2023-06-05 NOTE — Assessment & Plan Note (Signed)
-   cephALEXin (KEFLEX) 500 MG capsule; Take 1 capsule (500 mg total) by mouth 2 (two) times daily for 7 days.  Dispense: 14 capsule; Refill: 0   Follow up:  Follow up as needed

## 2023-06-05 NOTE — Patient Instructions (Signed)
1. Laceration of middle finger of left hand without complication, subsequent encounter  - cephALEXin (KEFLEX) 500 MG capsule; Take 1 capsule (500 mg total) by mouth 2 (two) times daily for 7 days.  Dispense: 14 capsule; Refill: 0   Follow up:  Follow up as needed
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
## 2023-06-12 ENCOUNTER — Other Ambulatory Visit: Payer: Self-pay

## 2023-06-12 ENCOUNTER — Other Ambulatory Visit (HOSPITAL_BASED_OUTPATIENT_CLINIC_OR_DEPARTMENT_OTHER): Payer: Self-pay

## 2023-06-12 ENCOUNTER — Encounter (HOSPITAL_BASED_OUTPATIENT_CLINIC_OR_DEPARTMENT_OTHER): Payer: Self-pay | Admitting: Emergency Medicine

## 2023-06-12 ENCOUNTER — Emergency Department (HOSPITAL_BASED_OUTPATIENT_CLINIC_OR_DEPARTMENT_OTHER)
Admission: EM | Admit: 2023-06-12 | Discharge: 2023-06-12 | Disposition: A | Discharge status: Home / Self Care | Payer: Medicaid Other | Attending: Emergency Medicine | Admitting: Emergency Medicine

## 2023-06-12 DIAGNOSIS — Z79899 Other long term (current) drug therapy: Secondary | ICD-10-CM | POA: Diagnosis not present

## 2023-06-12 DIAGNOSIS — K1120 Sialoadenitis, unspecified: Secondary | ICD-10-CM | POA: Insufficient documentation

## 2023-06-12 DIAGNOSIS — I10 Essential (primary) hypertension: Secondary | ICD-10-CM | POA: Insufficient documentation

## 2023-06-12 DIAGNOSIS — R220 Localized swelling, mass and lump, head: Secondary | ICD-10-CM | POA: Diagnosis present

## 2023-06-12 MED ORDER — AMOXICILLIN-POT CLAVULANATE 875-125 MG PO TABS
1.0000 | ORAL_TABLET | Freq: Two times a day (BID) | ORAL | 0 refills | Status: DC
  Filled 2023-06-12: qty 14, 7d supply, fill #0

## 2023-06-12 NOTE — Discharge Instructions (Signed)
As we discussed, I suspect that your symptoms are due to the parotitis that you were diagnosed with previously.  I have given you a prescription for Augmentin for you to take as prescribed in its entirety for management of this.  I have also given you a referral to an ear nose and throat doctor that you need to schedule close follow-up with for continued valuation and management of the symptoms.  Return if development of any new or worsening symptoms.

## 2023-06-12 NOTE — ED Provider Notes (Signed)
[place] EMERGENCY DEPARTMENT AT [facility] Provider Note   CSN: [id number] Arrival date & time: 06/12/23  1550     History  Chief Complaint  Patient presents with   Facial Swelling    Joseph Bautista is a 37 y.o. male.  Patient with history of hypertension presents today with complaints of right facial swelling.  He states that same began 2 days ago and has been persistent since then.  He states he had a similar episode of this in December and was diagnosed with parotitis and given augmentin for same with ENT referral. When he was scheduled appointment his symptoms had resolved and he therefore canceled his appointment.  He denies any fevers or chills.  No difficulty swallowing or dental pain.  No new medications.  He is up-to-date on his immunizations.  Denies any foul taste in his mouth.  Does note right ear pain, denies sore throat, cough, congestion.  No difficulty hearing or ear drainage.  The history is provided by the patient. No language interpreter was used.       Home Medications Prior to Admission medications   Medication Sig Start Date End Date Taking? Authorizing Provider  acetaminophen (TYLENOL) 325 MG tablet Take 2 tablets (650 mg total) by mouth every 4 (four) hours as needed for mild pain (do not take more than 4,000mg  in 24 hours). 03/20/20   Adam Phenix, PA-C  albuterol (VENTOLIN HFA) 108 (90 Base) MCG/ACT inhaler Inhale 2 puffs into the lungs every 6 (six) hours as needed for wheezing or shortness of breath (Cough, and/or prior to all physical activity). 04/20/23   Ivonne Andrew, NP  amLODipine (NORVASC) 5 MG tablet Take 1 tablet (5 mg total) by mouth daily. 08/28/22   Ivonne Andrew, NP  cephALEXin (KEFLEX) 500 MG capsule Take 1 capsule (500 mg total) by mouth 2 (two) times daily for 7 days. 06/05/23 06/12/23  Ivonne Andrew, NP  ibuprofen (ADVIL) 600 MG tablet Take 1 tablet (600 mg total) by mouth every 6 (six) hours as needed. 08/26/21    Merrilee Jansky, MD  lamoTRIgine (LAMICTAL) 150 MG tablet Take 150 mg by mouth daily.    [provider]  loratadine (CLARITIN) 10 MG tablet Take 1 tablet (10 mg total) by mouth daily. 03/27/23 06/25/23  Ivonne Andrew, NP  Multiple Vitamin (MULTIVITAMIN WITH MINERALS) TABS tablet Take 1 tablet by mouth daily.    [provider]  omeprazole (PRILOSEC) 20 MG capsule Take 1 capsule (20 mg total) by mouth daily. 05/18/23   Ivonne Andrew, NP  topiramate (TOPAMAX) 25 MG tablet Take 1 tablet (25 mg total) by mouth 2 (two) times daily. 06/09/18 09/02/19  Kallie Locks, FNP      Allergies    Hydrocodone, Tomato, Tomato, and Percocet [oxycodone-acetaminophen]    Review of Systems   Review of Systems  HENT:  Positive for facial swelling.   All other systems reviewed and are negative.   Physical Exam Updated Vital Signs BP 135/85 (BP Location: Right Arm)   Pulse 90   Temp 98.2 F (36.8 C) (Oral)   Resp 18   Ht 6\' 2"  (1.88 m)   Wt 107.5 kg   SpO2 100%   BMI 30.43 kg/m  Physical Exam Vitals and nursing note reviewed.  Constitutional:      General: He is not in acute distress.    Appearance: Normal appearance. He is normal weight. He is not ill-appearing, toxic-appearing or diaphoretic.  HENT:     Head: Normocephalic and atraumatic.     Right Ear: Ear canal and external ear normal.     Left Ear: Tympanic membrane, ear canal and external ear normal.     Ears:     Comments: Erythematous TM on the right    Nose: Nose normal.     Mouth/Throat:     Mouth: Mucous membranes are moist.     Comments: No purulent drainage from Stensen's duct or visualized stones.  No Koplik spots Eyes:     Extraocular Movements: Extraocular movements intact.     Conjunctiva/sclera: Conjunctivae normal.     Pupils: Pupils are equal, round, and reactive to light.  Neck:     Comments: Swelling and TTP noted to the right parotid gland.  No overlying skin changes, fluctuance, or induration.   No crepitus  No meningismus Cardiovascular:     Rate and Rhythm: Normal rate and regular rhythm.     Heart sounds: Normal heart sounds.  Pulmonary:     Effort: Pulmonary effort is normal. No respiratory distress.     Breath sounds: Normal breath sounds.  Abdominal:     General: Abdomen is flat.     Palpations: Abdomen is soft.     Tenderness: There is no abdominal tenderness.  Musculoskeletal:        General: Normal range of motion.     Cervical back: Normal range of motion and neck supple.  Skin:    General: Skin is warm and dry.  Neurological:     General: No focal deficit present.     Mental Status: He is alert.  Psychiatric:        Mood and Affect: Mood normal.        Behavior: Behavior normal.     ED Results / Procedures / Treatments   Labs (all labs ordered are listed, but only abnormal results are displayed) Labs Reviewed - No data to display  EKG None  Radiology No results found.  Procedures Procedures    Medications Ordered in ED Medications - No data to display  ED Course/ Medical Decision Making/ A&P                             Medical Decision Making  Patient presents today with complaints of right facial swelling x 2 days.  He is afebrile, nontoxic-appearing, and in no acute distress with reassuring vital signs.  Physical exam reveals TTP and swelling of the right parotid gland.  No cellulitic changes.  No fluctuance, induration or crepitus.  No purulent drainage from Stensen's duct or visualized salivary stones.  He does have an erythematous TM on the right side which is likely due to the same process.  Chart reviewed, patient was seen for same in 12/23 and was diagnosed with parotitis and was given Augmentin for same.  Given this, suspect his symptoms are due to same.  No indication for repeat laboratory evaluation or imaging at this time given he is so well-appearing without systemic symptoms.  No concern for mumps.  No intraoral swelling or concern  for Ludwig's angina.  Will give referral to ENT as he may need a biopsy to determine etiology given recurrence.  Will also give prescription for Augmentin as this helped last time. Evaluation and diagnostic testing in the emergency department does not suggest an emergent condition requiring admission or immediate intervention beyond what has been performed at this time.  Plan  for discharge with close PCP follow-up.  Patient is understanding and amenable with plan, educated on red flag symptoms that would prompt immediate return.  Patient discharged in stable condition.  Findings and plan of care discussed with supervising physician Dr. Jacqulyn Bath who is in agreement.    Final Clinical Impression(s) / ED Diagnoses Final diagnoses:  Parotitis not due to mumps    Rx / DC Orders ED Discharge Orders          Ordered    amoxicillin-clavulanate (AUGMENTIN) 875-125 MG tablet  Every 12 hours        06/12/23 1704          An After Visit Summary was printed and given to the patient.     Vear Clock 06/12/23 1712    Maia Plan, MD 06/16/23 [id number]

## 2023-06-12 NOTE — ED Triage Notes (Signed)
Pt arrives pov, steady gait with c/o RT side facial swelling and pain, RT ear pain x 2 days. Pt tx for same ~ 3-4 months ago. Reports difficulty chewing. Resolves after lying down

## 2023-06-15 ENCOUNTER — Telehealth: Payer: Self-pay

## 2023-06-15 NOTE — Transitions of Care (Post Inpatient/ED Visit) (Cosign Needed)
06/15/2023  Name: Joseph Bautista MRN: [id number] DOB: May 29, 1986  Today's TOC FU Call Status: Today's TOC FU Call Status:: Successful TOC FU Call Competed TOC FU Call Complete Date: 06/15/23  Transition Care Management Follow-up Telephone Call Date of Discharge: 06/12/23 Discharge Facility: [facility] ([facility]) Type of Discharge: Emergency Department Reason for ED Visit: Other: (ENT SWELLING) How have you been since you were released from the hospital?: Better Any questions or concerns?: No  Items Reviewed: Did you receive and understand the discharge instructions provided?: No Medications obtained,verified, and reconciled?: Yes (Medications Reviewed) Any new allergies since your discharge?: Yes Dietary orders reviewed?: NA Do you have support at home?: Yes People in Home: other relative(s)  Medications Reviewed Today: Medications Reviewed Today     Reviewed by Renelda Loma, RMA (Registered Medical Assistant) on 06/15/23 at 1335  Med List Status: <None>   Medication Order Taking? Sig Documenting Provider Last Dose Status Informant  acetaminophen (TYLENOL) 325 MG tablet [id number] Yes Take 2 tablets (650 mg total) by mouth every 4 (four) hours as needed for mild pain (do not take more than 4,000mg  in 24 hours). Adam Phenix, PA-C Taking Active            Med Note Kallie Locks   Mon May 07, 2020 11:11 AM) As needed.   albuterol (VENTOLIN HFA) 108 (90 Base) MCG/ACT inhaler [id number] Yes Inhale 2 puffs into the lungs every 6 (six) hours as needed for wheezing or shortness of breath (Cough, and/or prior to all physical activity). Ivonne Andrew, NP Taking Active   amLODipine (NORVASC) 5 MG tablet [id number] Yes Take 1 tablet (5 mg total) by mouth daily. Ivonne Andrew, NP Taking Active   amoxicillin-clavulanate (AUGMENTIN) 875-125 MG tablet [id number] Yes Take 1 tablet by mouth every 12 (twelve) hours. Smoot, Shawn Route, PA-C Taking Active   ibuprofen (ADVIL)  600 MG tablet [id number] Yes Take 1 tablet (600 mg total) by mouth every 6 (six) hours as needed. Merrilee Jansky, MD Taking Active            Med Note Renelda Loma   Thu Dec 25, 2022  2:15 PM) Prn   lamoTRIgine (LAMICTAL) 150 MG tablet [id number] Yes Take 150 mg by mouth daily. [provider] Taking Active Self  loratadine (CLARITIN) 10 MG tablet [id number] Yes Take 1 tablet (10 mg total) by mouth daily. Ivonne Andrew, NP Taking Active   Multiple Vitamin (MULTIVITAMIN WITH MINERALS) TABS tablet [id number] Yes Take 1 tablet by mouth daily. [provider] Taking Active Self  omeprazole (PRILOSEC) 20 MG capsule [id number] Yes Take 1 capsule (20 mg total) by mouth daily. Ivonne Andrew, NP Taking Active     Discontinued 09/02/19 1112          Med Note Kallie Locks   Fri May 06, 2019 11:15 AM) As needed.             Home Care and Equipment/Supplies: Were Home Health Services Ordered?: NA Any new equipment or medical supplies ordered?: NA  Functional Questionnaire: Do you need assistance with bathing/showering or dressing?: No Do you need assistance with meal preparation?: No Do you need assistance with eating?: No Do you have difficulty maintaining continence: No Do you need assistance with getting out of bed/getting out of a chair/moving?: No Do you have difficulty managing or taking your medications?: No  Follow up appointments reviewed: PCP Follow-up appointment confirmed?: Yes Date of PCP follow-up appointment?: 07/02/23 Follow-up Provider: Archie Patten  [facility] Follow-up appointment confirmed?: NA Do you need transportation to your follow-up appointment?: No Do you understand care options if your condition(s) worsen?: Yes-patient verbalized understanding    SIGNATURE. Renelda Loma RMA

## 2023-06-15 NOTE — Telephone Encounter (Signed)
Error

## 2023-06-23 ENCOUNTER — Telehealth: Payer: Self-pay | Admitting: Nurse Practitioner

## 2023-06-23 NOTE — Telephone Encounter (Signed)
Caller & Relationship to patient:  MRN #  [id number]   Call Back Number:   Date of Last Office Visit: 06/15/2023     Date of Next Office Visit: 07/02/2023    Medication(s) to be Refilled: Amoxicillin  Preferred Pharmacy: [facility] on [address]  ** Please notify patient to allow 48-72 hours to process** **Let patient know to contact pharmacy at the end of the day to make sure medication is ready. ** **If patient has not been seen in a year or longer, book an appointment **Advise to use MyChart for refill requests OR to contact their pharmacy

## 2023-06-26 ENCOUNTER — Other Ambulatory Visit: Payer: Self-pay | Admitting: Nurse Practitioner

## 2023-06-26 MED ORDER — AMOXICILLIN-POT CLAVULANATE 875-125 MG PO TABS: 1.0000 | ORAL_TABLET | Freq: Two times a day (BID) | ORAL | 0 refills | Status: DC

## 2023-06-29 NOTE — Telephone Encounter (Signed)
Called pt and advise. Gh

## 2023-07-02 ENCOUNTER — Ambulatory Visit (INDEPENDENT_AMBULATORY_CARE_PROVIDER_SITE_OTHER): Payer: MEDICAID | Admitting: Nurse Practitioner

## 2023-07-02 VITALS — BP 132/71 | HR 85 | Temp 97.1°F | Wt 239.0 lb

## 2023-07-02 DIAGNOSIS — K1120 Sialoadenitis, unspecified: Secondary | ICD-10-CM | POA: Diagnosis not present

## 2023-07-02 DIAGNOSIS — I10 Essential (primary) hypertension: Secondary | ICD-10-CM

## 2023-07-02 MED ORDER — AMLODIPINE BESYLATE 5 MG PO TABS: 5.0000 mg | ORAL_TABLET | Freq: Every day | ORAL | 3 refills | Status: DC

## 2023-07-02 MED ORDER — PREDNISONE 20 MG PO TABS
20.0000 mg | ORAL_TABLET | Freq: Every day | ORAL | 0 refills | Status: AC
Start: 2023-07-02 — End: 2023-07-07
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
## 2023-07-02 NOTE — Patient Instructions (Signed)
1. Essential hypertension  - amLODipine (NORVASC) 5 MG tablet; Take 1 tablet (5 mg total) by mouth daily.  Dispense: 90 tablet; Refill: 3  2. Parotitis not due to mumps  - predniSONE (DELTASONE) 20 MG tablet; Take 1 tablet (20 mg total) by mouth daily with breakfast for 5 days.  Dispense: 5 tablet; Refill: 0  -please keep upcoming appointment with ENT  Follow up:  Follow up as needed

## 2023-07-02 NOTE — Assessment & Plan Note (Signed)
-   amLODipine (NORVASC) 5 MG tablet; Take 1 tablet (5 mg total) by mouth daily.  Dispense: 90 tablet; Refill: 3  2. Parotitis not due to mumps  - predniSONE (DELTASONE) 20 MG tablet; Take 1 tablet (20 mg total) by mouth daily with breakfast for 5 days.  Dispense: 5 tablet; Refill: 0  -please keep upcoming appointment with ENT  Follow up:  Follow up as needed

## 2023-08-20 DIAGNOSIS — F319 Bipolar disorder, unspecified: Secondary | ICD-10-CM | POA: Insufficient documentation

## 2023-08-20 DIAGNOSIS — Z87891 Personal history of nicotine dependence: Secondary | ICD-10-CM | POA: Insufficient documentation

## 2023-08-27 ENCOUNTER — Telehealth: Payer: Self-pay | Admitting: Nurse Practitioner

## 2023-08-27 ENCOUNTER — Other Ambulatory Visit: Payer: Self-pay | Admitting: Nurse Practitioner

## 2023-08-27 DIAGNOSIS — I10 Essential (primary) hypertension: Secondary | ICD-10-CM

## 2023-08-27 MED ORDER — AMLODIPINE BESYLATE 5 MG PO TABS: 5.0000 mg | ORAL_TABLET | Freq: Every day | ORAL | 3 refills | Status: DC

## 2023-08-27 NOTE — Telephone Encounter (Signed)
Caller & Relationship to patient:   MRN #  [id number]   Call Back Number:   Date of Last Office Visit: 07/02/2023     Date of Next Office Visit: 09/28/2023    Medication(s) to be Refilled: Amlodipine  Preferred Pharmacy: [facility]  ** Please notify patient to allow 48-72 hours to process** **Let patient know to contact pharmacy at the end of the day to make sure medication is ready. ** **If patient has not been seen in a year or longer, book an appointment **Advise to use MyChart for refill requests OR to contact their pharmacy

## 2023-09-28 ENCOUNTER — Ambulatory Visit: Payer: Self-pay | Admitting: Nurse Practitioner

## 2023-09-30 ENCOUNTER — Encounter: Payer: Self-pay | Admitting: Nurse Practitioner

## 2023-09-30 ENCOUNTER — Ambulatory Visit (INDEPENDENT_AMBULATORY_CARE_PROVIDER_SITE_OTHER): Payer: MEDICAID | Admitting: Nurse Practitioner

## 2023-09-30 VITALS — BP 135/86 | HR 72 | Temp 98.1°F | Resp 12 | Ht 74.0 in | Wt 239.0 lb

## 2023-09-30 DIAGNOSIS — I10 Essential (primary) hypertension: Secondary | ICD-10-CM | POA: Diagnosis not present

## 2023-09-30 DIAGNOSIS — Z13220 Encounter for screening for lipoid disorders: Secondary | ICD-10-CM | POA: Diagnosis not present
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
## 2023-09-30 NOTE — Patient Instructions (Addendum)
1. Lipid screening  - Lipid Panel  2. Essential hypertension  - CBC - Comprehensive metabolic panel - Lipid Panel  Follow up:  Follow up in 6 months

## 2023-10-01 LAB — LIPID PANEL
Chol/HDL Ratio: 4.1 {ratio} (ref 0.0–5.0)
Cholesterol, Total: 161 mg/dL (ref 100–199)
HDL: 39 mg/dL — ABNORMAL LOW (ref >39)
LDL Chol Calc (NIH): 89 mg/dL (ref 0–99)
Triglycerides: 190 mg/dL — ABNORMAL HIGH (ref 0–149)
VLDL Cholesterol Cal: 33 mg/dL (ref 5–40)

## 2023-10-01 LAB — COMPREHENSIVE METABOLIC PANEL
ALT: 34 [IU]/L (ref 0–44)
AST: 33 [IU]/L (ref 0–40)
Albumin: 4.5 g/dL (ref 4.1–5.1)
Alkaline Phosphatase: 98 [IU]/L (ref 44–121)
BUN/Creatinine Ratio: 16 (ref 9–20)
BUN: 19 mg/dL (ref 6–20)
Bilirubin Total: 0.3 mg/dL (ref 0.0–1.2)
CO2: 24 mmol/L (ref 20–29)
Calcium: 9.3 mg/dL (ref 8.7–10.2)
Chloride: 102 mmol/L (ref 96–106)
Creatinine, Ser: 1.16 mg/dL (ref 0.76–1.27)
Globulin, Total: 2.4 g/dL (ref 1.5–4.5)
Glucose: 85 mg/dL (ref 70–99)
Potassium: 4.2 mmol/L (ref 3.5–5.2)
Sodium: 141 mmol/L (ref 134–144)
Total Protein: 6.9 g/dL (ref 6.0–8.5)
eGFR: 82 mL/min/{1.73_m2} (ref >59)

## 2023-10-01 LAB — CBC
Hematocrit: 44.7 % (ref 37.5–51.0)
Hemoglobin: 14.7 g/dL (ref 13.0–17.7)
MCH: 30.9 pg (ref 26.6–33.0)
MCHC: 32.9 g/dL (ref 31.5–35.7)
MCV: 94 fL (ref 79–97)
Platelets: 209 10*3/uL (ref 150–450)
RBC: 4.75 x10E6/uL (ref 4.14–5.80)
RDW: 12.7 % (ref 11.6–15.4)
WBC: 6.4 10*3/uL (ref 3.4–10.8)

## 2023-11-05 ENCOUNTER — Encounter: Payer: Self-pay | Admitting: Nurse Practitioner

## 2023-11-05 ENCOUNTER — Ambulatory Visit (INDEPENDENT_AMBULATORY_CARE_PROVIDER_SITE_OTHER): Payer: MEDICAID | Admitting: Nurse Practitioner

## 2023-11-05 VITALS — BP 131/82 | HR 85 | Temp 97.3°F | Wt 247.0 lb

## 2023-11-05 DIAGNOSIS — Z113 Encounter for screening for infections with a predominantly sexual mode of transmission: Secondary | ICD-10-CM | POA: Diagnosis not present

## 2023-11-05 NOTE — Patient Instructions (Addendum)
1. Screen for STD (sexually transmitted disease)  - Chlamydia/Gonococcus/Trichomonas, NAA - RPR+HIV+GC+CT Panel  Follow up:  Follow up as needed
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
## 2023-11-07 LAB — CHLAMYDIA/GONOCOCCUS/TRICHOMONAS, NAA
Chlamydia by NAA: NEGATIVE
Gonococcus by NAA: NEGATIVE
Trich vag by NAA: NEGATIVE

## 2023-11-08 LAB — RPR+HIV+GC+CT PANEL
Chlamydia trachomatis, NAA: NEGATIVE
HIV Screen 4th Generation wRfx: NONREACTIVE
Neisseria Gonorrhoeae by PCR: NEGATIVE
RPR Ser Ql: NONREACTIVE

## 2023-11-21 ENCOUNTER — Emergency Department (HOSPITAL_BASED_OUTPATIENT_CLINIC_OR_DEPARTMENT_OTHER): Payer: MEDICAID

## 2023-11-21 ENCOUNTER — Emergency Department (HOSPITAL_BASED_OUTPATIENT_CLINIC_OR_DEPARTMENT_OTHER)
Admission: EM | Admit: 2023-11-21 | Discharge: 2023-11-21 | Disposition: A | Discharge status: Home / Self Care | Payer: MEDICAID | Attending: Emergency Medicine | Admitting: Emergency Medicine

## 2023-11-21 ENCOUNTER — Encounter (HOSPITAL_BASED_OUTPATIENT_CLINIC_OR_DEPARTMENT_OTHER): Payer: Self-pay

## 2023-11-21 ENCOUNTER — Other Ambulatory Visit: Payer: Self-pay

## 2023-11-21 DIAGNOSIS — M25561 Pain in right knee: Secondary | ICD-10-CM | POA: Diagnosis present

## 2023-11-21 DIAGNOSIS — M79604 Pain in right leg: Secondary | ICD-10-CM | POA: Insufficient documentation

## 2023-11-21 MED ORDER — LIDOCAINE 5 % EX PTCH: 1.0000 | MEDICATED_PATCH | CUTANEOUS | 0 refills | Status: DC
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
## 2023-11-21 NOTE — Discharge Instructions (Addendum)
Is a were seen for right knee pain.  Please pick up your prescription and take as needed.  You may alternate taking Tylenol Motrin as needed.  Do not take Motrin for greater than 5 days this may cause rebound headaches.  Please schedule an appointment with your PCP if your pain persist for further evaluation and treatment.  Thank you for letting us treat you today. After performing a physical exam and reviewing your imaging, I feel you are safe to go home. Please follow up with your PCP in the next several days and provide them with your records from this visit. Return to the Emergency Room if pain becomes severe or symptoms worsen.

## 2023-12-31 DIAGNOSIS — K219 Gastro-esophageal reflux disease without esophagitis: Secondary | ICD-10-CM | POA: Insufficient documentation

## 2024-02-26 ENCOUNTER — Ambulatory Visit (INDEPENDENT_AMBULATORY_CARE_PROVIDER_SITE_OTHER): Payer: MEDICAID | Admitting: Radiology

## 2024-02-26 ENCOUNTER — Ambulatory Visit
Admission: EM | Admit: 2024-02-26 | Discharge: 2024-02-26 | Disposition: A | Discharge status: Home / Self Care | Payer: MEDICAID | Attending: Emergency Medicine | Admitting: Emergency Medicine

## 2024-02-26 ENCOUNTER — Other Ambulatory Visit: Payer: Self-pay

## 2024-02-26 ENCOUNTER — Encounter: Payer: Self-pay | Admitting: Emergency Medicine

## 2024-02-26 ENCOUNTER — Other Ambulatory Visit (HOSPITAL_BASED_OUTPATIENT_CLINIC_OR_DEPARTMENT_OTHER): Payer: Self-pay

## 2024-02-26 ENCOUNTER — Ambulatory Visit: Payer: Self-pay | Admitting: Nurse Practitioner

## 2024-02-26 DIAGNOSIS — J312 Chronic pharyngitis: Secondary | ICD-10-CM

## 2024-02-26 DIAGNOSIS — R051 Acute cough: Secondary | ICD-10-CM

## 2024-02-26 DIAGNOSIS — M79602 Pain in left arm: Secondary | ICD-10-CM

## 2024-02-26 DIAGNOSIS — K219 Gastro-esophageal reflux disease without esophagitis: Secondary | ICD-10-CM

## 2024-02-26 MED ORDER — OMEPRAZOLE 20 MG PO CPDR
20.0000 mg | DELAYED_RELEASE_CAPSULE | Freq: Every day | ORAL | 3 refills | Status: DC
Start: 2024-02-26 — End: 2024-02-26
  Filled 2024-02-26: qty 30, 30d supply, fill #0

## 2024-02-26 MED ORDER — OMEPRAZOLE 20 MG PO CPDR
20.0000 mg | DELAYED_RELEASE_CAPSULE | Freq: Every day | ORAL | 0 refills | Status: DC
Start: 2024-02-26 — End: 2024-03-23
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
## 2024-02-26 NOTE — Discharge Instructions (Signed)
 Your chest x-ray did not show any obvious pneumonia.  Your EKG was reassuring.  Your body pain and cough may be due to a viral illness, that should pass over the next 5 to 7 days.  You can take 500 mg of Tylenol 100 mg of ibuprofen every 4-6 hours to help with any body aches or pains.  For cough you can use over-the-counter cough drops and cough syrups.  Seek immediate care if you develop severe chest pain, shortness of breath, weakness, vision changes, or any new concerning symptoms at the nearest emergency department for further advanced evaluation.

## 2024-02-26 NOTE — ED Triage Notes (Signed)
 Pt c/o pain in abdomin and chest that comes and goes for 1 week. This morning he felt left arm was tight and he had BM which made pain better.  Pt was taking prilosec but ran out of prescription

## 2024-03-23 ENCOUNTER — Other Ambulatory Visit: Payer: Self-pay

## 2024-03-23 ENCOUNTER — Emergency Department (HOSPITAL_BASED_OUTPATIENT_CLINIC_OR_DEPARTMENT_OTHER)
Admission: EM | Admit: 2024-03-23 | Discharge: 2024-03-23 | Disposition: A | Discharge status: Home / Self Care | Payer: MEDICAID | Attending: Emergency Medicine | Admitting: Emergency Medicine

## 2024-03-23 ENCOUNTER — Emergency Department (HOSPITAL_BASED_OUTPATIENT_CLINIC_OR_DEPARTMENT_OTHER): Payer: MEDICAID

## 2024-03-23 ENCOUNTER — Encounter (HOSPITAL_BASED_OUTPATIENT_CLINIC_OR_DEPARTMENT_OTHER): Payer: Self-pay

## 2024-03-23 DIAGNOSIS — R002 Palpitations: Secondary | ICD-10-CM | POA: Diagnosis present

## 2024-03-23 DIAGNOSIS — J45909 Unspecified asthma, uncomplicated: Secondary | ICD-10-CM | POA: Diagnosis not present

## 2024-03-23 DIAGNOSIS — R0789 Other chest pain: Secondary | ICD-10-CM | POA: Insufficient documentation

## 2024-03-23 DIAGNOSIS — I10 Essential (primary) hypertension: Secondary | ICD-10-CM | POA: Insufficient documentation

## 2024-03-23 DIAGNOSIS — F17210 Nicotine dependence, cigarettes, uncomplicated: Secondary | ICD-10-CM | POA: Insufficient documentation

## 2024-03-23 DIAGNOSIS — R0602 Shortness of breath: Secondary | ICD-10-CM | POA: Insufficient documentation

## 2024-03-23 DIAGNOSIS — J312 Chronic pharyngitis: Secondary | ICD-10-CM

## 2024-03-23 LAB — BASIC METABOLIC PANEL WITH GFR
Anion gap: 9 (ref 5–15)
BUN: 13 mg/dL (ref 6–20)
CO2: 27 mmol/L (ref 22–32)
Calcium: 8.7 mg/dL — ABNORMAL LOW (ref 8.9–10.3)
Chloride: 100 mmol/L (ref 98–111)
Creatinine, Ser: 0.9 mg/dL (ref 0.61–1.24)
GFR, Estimated: >60 mL/min (ref >60)
Glucose, Bld: 103 mg/dL — ABNORMAL HIGH (ref 70–99)
Potassium: 3.7 mmol/L (ref 3.5–5.1)
Sodium: 136 mmol/L (ref 135–145)

## 2024-03-23 LAB — CBC
HCT: 43 % (ref 39.0–52.0)
Hemoglobin: 15.1 g/dL (ref 13.0–17.0)
MCH: 31.3 pg (ref 26.0–34.0)
MCHC: 35.1 g/dL (ref 30.0–36.0)
MCV: 89 fL (ref 80.0–100.0)
Platelets: 198 10*3/uL (ref 150–400)
RBC: 4.83 MIL/uL (ref 4.22–5.81)
RDW: 12.7 % (ref 11.5–15.5)
WBC: 6.5 10*3/uL (ref 4.0–10.5)
nRBC: 0 % (ref 0.0–0.2)

## 2024-03-23 LAB — MAGNESIUM: Magnesium: 1.8 mg/dL (ref 1.7–2.4)

## 2024-03-23 LAB — TROPONIN I (HIGH SENSITIVITY): Troponin I (High Sensitivity): 4 ng/L (ref <18)

## 2024-03-23 MED ORDER — OMEPRAZOLE 20 MG PO CPDR: 20.0000 mg | DELAYED_RELEASE_CAPSULE | Freq: Every day | ORAL | 0 refills | Status: DC

## 2024-03-23 MED ORDER — ALUM & MAG HYDROXIDE-SIMETH 200-200-20 MG/5ML PO SUSP
30.0000 mL | Freq: Once | ORAL | Status: AC
Start: 2024-03-23 — End: 2024-03-23
  Administered 2024-03-23: 30 mL via ORAL
  Filled 2024-03-23: qty 30

## 2024-03-23 MED ORDER — LORAZEPAM 1 MG PO TABS
1.0000 mg | ORAL_TABLET | Freq: Once | ORAL | Status: AC
  Administered 2024-03-23: 1 mg via ORAL
  Filled 2024-03-23: qty 1

## 2024-03-23 MED ORDER — PANTOPRAZOLE SODIUM 40 MG PO TBEC
40.0000 mg | DELAYED_RELEASE_TABLET | Freq: Once | ORAL | Status: AC
  Administered 2024-03-23: 40 mg via ORAL
  Filled 2024-03-23: qty 1

## 2024-03-23 MED ORDER — AMLODIPINE BESYLATE 5 MG PO TABS: 10.0000 mg | ORAL_TABLET | Freq: Every day | ORAL | 0 refills | Status: DC

## 2024-03-23 NOTE — ED Triage Notes (Signed)
 Pt states that he has been having SOB since yesterday, hx of asthma, SOB is unrelieved by home inhaler. Pt states he just got over a cold, no fevers at home.

## 2024-03-23 NOTE — ED Provider Notes (Addendum)
 Here with palpitations.  Unremarkable workup.  Awaiting chest x-ray.  Lab work including troponin normal.  Chest x-ray unremarkable.  Recommend that he continue his PPI.  Will refill his omeprazole.  Will increase his amlodipine to 10 mg daily and have him follow-up with his primary care doctor.  Discharged in good condition.  This chart was dictated using voice recognition software.  Despite best efforts to proofread,  errors can occur which can change the documentation meaning.    Virgina Norfolk, DO 03/23/24 6213    Virgina Norfolk, DO 03/23/24 [phone]

## 2024-03-23 NOTE — ED Notes (Signed)
 Dc instructions reviewed with patient. Patient voiced understanding. Dc with belongings.
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
## 2024-03-24 ENCOUNTER — Ambulatory Visit: Payer: Self-pay

## 2024-03-24 ENCOUNTER — Other Ambulatory Visit: Payer: Self-pay

## 2024-03-24 ENCOUNTER — Emergency Department (HOSPITAL_BASED_OUTPATIENT_CLINIC_OR_DEPARTMENT_OTHER)
Admission: EM | Admit: 2024-03-24 | Discharge: 2024-03-24 | Disposition: A | Discharge status: Home / Self Care | Payer: MEDICAID | Attending: Emergency Medicine | Admitting: Emergency Medicine

## 2024-03-24 ENCOUNTER — Telehealth: Payer: Self-pay

## 2024-03-24 ENCOUNTER — Encounter: Payer: Self-pay | Admitting: Family Medicine

## 2024-03-24 ENCOUNTER — Ambulatory Visit: Payer: MEDICAID | Admitting: Family Medicine

## 2024-03-24 VITALS — BP 156/110 | HR 89 | Temp 97.1°F | Wt 260.4 lb

## 2024-03-24 DIAGNOSIS — I10 Essential (primary) hypertension: Secondary | ICD-10-CM | POA: Insufficient documentation

## 2024-03-24 DIAGNOSIS — I159 Secondary hypertension, unspecified: Secondary | ICD-10-CM | POA: Insufficient documentation

## 2024-03-24 DIAGNOSIS — Z7982 Long term (current) use of aspirin: Secondary | ICD-10-CM | POA: Diagnosis not present

## 2024-03-24 DIAGNOSIS — Z79899 Other long term (current) drug therapy: Secondary | ICD-10-CM | POA: Insufficient documentation

## 2024-03-24 DIAGNOSIS — W3400XA Accidental discharge from unspecified firearms or gun, initial encounter: Secondary | ICD-10-CM

## 2024-03-24 MED ORDER — INDAPAMIDE 2.5 MG PO TABS: 2.5000 mg | ORAL_TABLET | Freq: Every day | ORAL | 0 refills | Status: DC

## 2024-03-24 MED ORDER — HYDRALAZINE HCL 20 MG/ML IJ SOLN
5.0000 mg | Freq: Once | INTRAMUSCULAR | Status: AC
  Administered 2024-03-24: 5 mg via INTRAMUSCULAR
  Filled 2024-03-24: qty 1

## 2024-03-24 MED ORDER — IBUPROFEN 800 MG PO TABS
800.0000 mg | ORAL_TABLET | Freq: Once | ORAL | Status: AC
  Administered 2024-03-24: 800 mg via ORAL
  Filled 2024-03-24: qty 1

## 2024-03-24 MED ORDER — AMLODIPINE BESYLATE 10 MG PO TABS: 10.0000 mg | ORAL_TABLET | Freq: Every day | ORAL | 0 refills | Status: DC

## 2024-03-24 MED ORDER — ASPIRIN 81 MG PO TBEC: 81.0000 mg | DELAYED_RELEASE_TABLET | Freq: Every day | ORAL | Status: AC

## 2024-03-24 MED ORDER — ACETAMINOPHEN 325 MG PO TABS
650.0000 mg | ORAL_TABLET | Freq: Once | ORAL | Status: AC
Start: 2024-03-24 — End: 2024-03-24
  Administered 2024-03-24: 650 mg via ORAL
  Filled 2024-03-24: qty 2

## 2024-03-24 NOTE — ED Provider Notes (Signed)
 [facility] EMERGENCY DEPARTMENT AT [facility] - [facility] Provider Note   CSN: [id number] Arrival date & time: 03/24/24  1953     History  Chief Complaint  Patient presents with   Hypertension    Joseph Bautista is a 38 y.o. male.  Patient is a 38 year old male presenting for elevated blood pressure reading.  On arrival blood pressure is 153/109.  Patient admits to a headache currently with no other symptoms.  Chart review states he was seen last night for palpitations.  He had a stable cardiac workup and his symptoms were thought to be likely secondary to hypertension.  He follow-up with his primary care physician today at [facility] group who increase his amlodipine to 5 mg dose to 10 mg daily.  They also started him on indapamide 2.5 mg.  He denies any chest pain, shortness of breath, or palpitations currently.  He denies blurred vision.  The history is provided by the patient. No language interpreter was used.  Hypertension Associated symptoms include headaches. Pertinent negatives include no chest pain, no abdominal pain and no shortness of breath.       Home Medications Prior to Admission medications   Medication Sig Start Date End Date Taking? Authorizing Provider  acetaminophen (TYLENOL) 325 MG tablet Take 2 tablets (650 mg total) by mouth every 4 (four) hours as needed for mild pain (do not take more than 4,000mg  in 24 hours). 03/20/20   Adam Phenix, PA-C  albuterol (VENTOLIN HFA) 108 (90 Base) MCG/ACT inhaler Inhale 2 puffs into the lungs every 6 (six) hours as needed for wheezing or shortness of breath (Cough, and/or prior to all physical activity). 04/20/23   Ivonne Andrew, NP  amLODipine (NORVASC) 10 MG tablet Take 1 tablet (10 mg total) by mouth daily. 03/24/24   Garnette Gunner, MD  amoxicillin-clavulanate (AUGMENTIN) 875-125 MG tablet Take 1 tablet by mouth every 12 (twelve) hours. 06/26/23   Ivonne Andrew, NP  aspirin EC 81 MG tablet Take 1 tablet (81 mg  total) by mouth daily. Swallow whole. 03/24/24   Garnette Gunner, MD  ibuprofen (ADVIL) 600 MG tablet Take 1 tablet (600 mg total) by mouth every 6 (six) hours as needed. 08/26/21   Lamptey, Britta Mccreedy, MD  indapamide (LOZOL) 2.5 MG tablet Take 1 tablet (2.5 mg total) by mouth daily. 03/24/24   Garnette Gunner, MD  lamoTRIgine (LAMICTAL) 150 MG tablet Take 150 mg by mouth daily.    [provider]  lidocaine (LIDODERM) 5 % Place 1 patch onto the skin daily. Remove & Discard patch within 12 hours or as directed by MD 11/21/23   Dolphus Jenny, PA-C  loratadine (CLARITIN) 10 MG tablet Take 1 tablet (10 mg total) by mouth daily. 03/27/23 11/05/23  Ivonne Andrew, NP  Multiple Vitamin (MULTIVITAMIN WITH MINERALS) TABS tablet Take 1 tablet by mouth daily.    [provider]  omeprazole (PRILOSEC) 20 MG capsule Take 1 capsule (20 mg total) by mouth daily. 03/23/24   Curatolo, Adam, DO  topiramate (TOPAMAX) 25 MG tablet Take 1 tablet (25 mg total) by mouth 2 (two) times daily. 06/09/18 09/02/19  Kallie Locks, FNP      Allergies    Hydrocodone, Tomato, Tomato, and Percocet [oxycodone-acetaminophen]    Review of Systems   Review of Systems  Constitutional:  Negative for chills and fever.  HENT:  Negative for ear pain and sore throat.   Eyes:  Negative for pain and visual disturbance.  Respiratory:  Negative for cough and shortness of breath.   Cardiovascular:  Negative for chest pain and palpitations.  Gastrointestinal:  Negative for abdominal pain and vomiting.  Genitourinary:  Negative for dysuria and hematuria.  Musculoskeletal:  Negative for arthralgias and back pain.  Skin:  Negative for color change and rash.  Neurological:  Positive for dizziness and headaches. Negative for seizures and syncope.  All other systems reviewed and are negative.   Physical Exam Updated Vital Signs BP 127/84   Pulse 81   Temp (!) 97.2 F (36.2 C)   Resp 14   Ht 6\' 2"  (1.88 m)   Wt 108.9  kg   SpO2 97%   BMI 30.81 kg/m  Physical Exam Vitals and nursing note reviewed.  Constitutional:      General: He is not in acute distress.    Appearance: He is well-developed.  HENT:     Head: Normocephalic and atraumatic.  Eyes:     Conjunctiva/sclera: Conjunctivae normal.  Cardiovascular:     Rate and Rhythm: Normal rate and regular rhythm.     Heart sounds: No murmur heard. Pulmonary:     Effort: Pulmonary effort is normal. No respiratory distress.     Breath sounds: Normal breath sounds.  Abdominal:     Palpations: Abdomen is soft.     Tenderness: There is no abdominal tenderness.  Musculoskeletal:        General: No swelling.     Cervical back: Neck supple.  Skin:    General: Skin is warm and dry.     Capillary Refill: Capillary refill takes less than 2 seconds.  Neurological:     Mental Status: He is alert.  Psychiatric:        Mood and Affect: Mood normal.     ED Results / Procedures / Treatments   Labs (all labs ordered are listed, but only abnormal results are displayed) Labs Reviewed - No data to display  EKG None  Radiology DG Chest [facility] 1 View Result Date: 03/23/2024 CLINICAL DATA:  Shortness of breath. EXAM: PORTABLE CHEST 1 VIEW COMPARISON:  02/26/2024 FINDINGS: Low volume chest. There is no edema, consolidation, effusion, or pneumothorax. Borderline heart size accentuated by technique. Unremarkable mediastinal contours. Artifact from EKG leads. IMPRESSION: No active disease. Electronically Signed   By: Tiburcio Pea M.D.   On: 03/23/2024 07:05    Procedures Procedures    Medications Ordered in ED Medications  hydrALAZINE (APRESOLINE) injection 5 mg (5 mg Intramuscular Given 03/24/24 2235)  ibuprofen (ADVIL) tablet 800 mg (800 mg Oral Given 03/24/24 2234)  acetaminophen (TYLENOL) tablet 650 mg (650 mg Oral Given 03/24/24 2234)    ED Course/ Medical Decision Making/ A&P                                 Medical Decision Making Risk OTC  drugs. Prescription drug management.   38 year old male presenting for elevated blood pressure readings.  On exam he is alert and oriented x 3, no acute distress, afebrile, stable vital signs.  Physical exam demonstrates no acute or alarming concerns.    Patient given hydralazine to bring down diastolic blood pressure.  Blood pressure on arrival 153/109.  Otherwise stable vital signs.  Patient recommended to continue follow-up with his PCP for further management of antihypertensives.  New dosing changes were created today by his PCP.  Patient recommended to continue to follow that protocol.  He is  also recommended to decrease salt, alcohol, and caffeine use to help improve his blood pressure.  He is recommended for exercise and a healthy diet.  He is given educational handout on antihypertensive recommendations.        Final Clinical Impression(s) / ED Diagnoses Final diagnoses:  Secondary hypertension    Rx / DC Orders ED Discharge Orders     None         Franne Forts, DO 03/24/24 2335
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
## 2024-03-24 NOTE — ED Triage Notes (Signed)
 Pt POV reporting persistent HTN, seen last night in ED for same, discharged and told to followup with PCP, went today and prescribed amlodipine and indapamide, took both with minimal improvement.

## 2024-03-24 NOTE — ED Notes (Signed)
Reviewed discharge instructions and home care with pt. Pt verbalized understanding and had no further questions. Pt exited ED without complications.

## 2024-03-24 NOTE — Telephone Encounter (Signed)
 Chief Complaint: High BP Symptoms: lightheadedness Frequency: ongoing, worsened last night - patient went to ED Pertinent Negatives: Patient denies numbness, weakness in arms or legs Disposition: [] ED /[] Urgent Care (no appt availability in office) / [x] Appointment(In office/virtual)/ []  [facility]/ [] Home Care/ [] Refused Recommended Disposition /[] [facility]/ []  Follow-up with PCP Additional Notes: Patient called in to make an appt to get assessed for his hypertension. Patient was seen at ED last night for hypertension and told to go home and take two of his Amlodopine 5 mg. Patient took 10 mg of Amlodopine when he got home last night and woke up with a BP of 174/114. When patient called in to nurse triage, this RN had patient sit and take deep breaths and take BP and it was 149/95. Patient is having intermittent lightheadedness. Patient appt made for asap today for evaluation. Patient states he normally takes 1 of the 5 mg Amlodopine tablets daily, and this RN advised to go ahead and take one for today. Patient also encouraged to bring blood pressure monitor to his appt to ensure he is using it correctly and to compare to reading at PCP office.   Copied from CRM [id number]. Topic: Clinical - Red Word Triage >> Mar 24, 2024  9:13 AM Izetta Dakin wrote: [facility] that prompted transfer to Nurse Triage: BP 174/114, Lightheaded/headache when standing Reason for Disposition  Systolic BP  >= 180 OR Diastolic >= 110  Answer Assessment - Initial Assessment Questions 1. BLOOD PRESSURE: "What is the blood pressure?" "Did you take at least two measurements 5 minutes apart?"     174/114 before the call / 149/95 2. ONSET: "When did you take your blood pressure?"     This morning  3. HOW: "How did you take your blood pressure?" (e.g., automatic home BP monitor, visiting nurse)     Blood pressure cuff 4. HISTORY: "Do you have a history of high blood pressure?"     Not diagnosed  5.  MEDICINES: "Are you taking any medicines for blood pressure?" "Have you missed any doses recently?"     Amlodopine 5 mg 6. OTHER SYMPTOMS: "Do you have any symptoms?" (e.g., blurred vision, chest pain, difficulty breathing, headache, weakness)     Headache and lightheadedness  Protocols used: Blood Pressure - High-A-AH

## 2024-03-24 NOTE — Transitions of Care (Post Inpatient/ED Visit) (Signed)
   03/24/2024  Name: Joseph Bautista MRN: [id number] DOB: [birth year]  Today's TOC FU Call Status:    Attempted to reach the patient regarding the most recent Inpatient/ED visit.  Follow Up Plan: Additional outreach attempts will be made to reach the patient to complete the Transitions of Care (Post Inpatient/ED visit) call.   Signature Renelda Loma RMA
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
## 2024-03-24 NOTE — Discharge Instructions (Addendum)
 Please continue to take your blood pressure medications including amlodipine 10 mg (2 of your 5 mg tablets) in the morning as well as indapamide 2.5 mg once a day blood pressure control.  Managing your blood pressure will help improve headaches, palpitations, blurred vision, etc.  Decrease salt, decrease caffeine, decrease alcohol use.  Exercise frequently and eat a healthy diet.  Follow-up with your primary care physician in the next 2 to 3 weeks for blood pressure recheck and further management of medications.

## 2024-03-25 ENCOUNTER — Telehealth: Payer: Self-pay

## 2024-03-25 NOTE — Transitions of Care (Post Inpatient/ED Visit) (Cosign Needed)
 03/25/2024  Name: Joseph Bautista MRN: [id number] DOB: 02/27/1986  Today's TOC FU Call Status: Today's TOC FU Call Status:: Successful TOC FU Call Completed TOC FU Call Complete Date: 03/25/24 Patient's Name and Date of Birth confirmed.  Transition Care Management Follow-up Telephone Call Date of Discharge: 03/23/24 Discharge Facility: [facility] ([facility]) Type of Discharge: Emergency Department Reason for ED Visit: Cardiac Conditions Cardiac Conditions Diagnosis:  (light headed/ HTN) How have you been since you were released from the hospital?: Better Any questions or concerns?: No  Items Reviewed: Did you receive and understand the discharge instructions provided?: Yes Medications obtained,verified, and reconciled?: Yes (Medications Reviewed) Any new allergies since your discharge?: No Dietary orders reviewed?: NA Do you have support at home?: Yes People in Home [RPT]: significant other  Medications Reviewed Today: Medications Reviewed Today     Reviewed by Renelda Loma, RMA (Registered Medical Assistant) on 03/25/24 at 1021  Med List Status: <None>   Medication Order Taking? Sig Documenting Provider Last Dose Status Informant  acetaminophen (TYLENOL) 325 MG tablet [id number] Yes Take 2 tablets (650 mg total) by mouth every 4 (four) hours as needed for mild pain (do not take more than 4,000mg  in 24 hours). Adam Phenix, PA-C Taking Active            Med Note Kallie Locks   Mon May 07, 2020 11:11 AM) As needed.   albuterol (VENTOLIN HFA) 108 (90 Base) MCG/ACT inhaler [id number] Yes Inhale 2 puffs into the lungs every 6 (six) hours as needed for wheezing or shortness of breath (Cough, and/or prior to all physical activity). Ivonne Andrew, NP Taking Active   amLODipine (NORVASC) 10 MG tablet [id number] Yes Take 1 tablet (10 mg total) by mouth daily. Garnette Gunner, MD Taking Active   amoxicillin-clavulanate (AUGMENTIN) 875-125 MG tablet [id number]   Take 1 tablet by mouth every 12 (twelve) hours. Ivonne Andrew, NP  Active   aspirin EC 81 MG tablet [id number] No Take 1 tablet (81 mg total) by mouth daily. Swallow whole.  Patient not taking: Reported on 03/25/2024   Garnette Gunner, MD Not Taking Active   ibuprofen (ADVIL) 600 MG tablet [id number] Yes Take 1 tablet (600 mg total) by mouth every 6 (six) hours as needed. Merrilee Jansky, MD Taking Active            Med Note Renelda Loma   Thu Dec 25, 2022  2:15 PM) Prn   indapamide (LOZOL) 2.5 MG tablet [id number] Yes Take 1 tablet (2.5 mg total) by mouth daily. Garnette Gunner, MD Taking Active   lamoTRIgine (LAMICTAL) 150 MG tablet [id number] No Take 150 mg by mouth daily.  Patient not taking: Reported on 03/25/2024   [provider] Not Taking Active Self  lidocaine (LIDODERM) 5 % [id number] No Place 1 patch onto the skin daily. Remove & Discard patch within 12 hours or as directed by MD  Patient not taking: Reported on 03/25/2024   Dolphus Jenny, PA-C Not Taking Active   loratadine (CLARITIN) 10 MG tablet [id number] Yes Take 1 tablet (10 mg total) by mouth daily. Ivonne Andrew, NP Taking Active   Multiple Vitamin (MULTIVITAMIN WITH MINERALS) TABS tablet [id number] No Take 1 tablet by mouth daily.  Patient not taking: Reported on 03/25/2024   [provider] Not Taking Active Self  omeprazole (PRILOSEC) 20 MG capsule [id number] Yes Take 1 capsule (20 mg total) by mouth daily. Virgina Norfolk, DO Taking Active  Discontinued 09/02/19 1112          Med Note Kallie Locks   Fri May 06, 2019 11:15 AM) As needed.             Home Care and Equipment/Supplies: Were Home Health Services Ordered?: NA Any new equipment or medical supplies ordered?: NA  Functional Questionnaire: Do you need assistance with bathing/showering or dressing?: No Do you need assistance with meal preparation?: No Do you need assistance with eating?: No Do you have difficulty  maintaining continence: No Do you need assistance with getting out of bed/getting out of a chair/moving?: No Do you have difficulty managing or taking your medications?: No  Follow up appointments reviewed: PCP Follow-up appointment confirmed?: Yes Date of PCP follow-up appointment?: 03/30/24 Follow-up Provider: Angus Seller Specialist [facility] Follow-up appointment confirmed?: NA Do you need transportation to your follow-up appointment?: No Do you understand care options if your condition(s) worsen?: Yes-patient verbalized understanding  SDOH Interventions Today    Flowsheet Row Most Recent Value  SDOH Interventions   Food Insecurity Interventions Intervention Not Indicated  Housing Interventions Intervention Not Indicated  Transportation Interventions Intervention Not Indicated  Utilities Interventions Intervention Not Indicated       SIGNATURE.  Renelda Loma RMA

## 2024-03-30 ENCOUNTER — Encounter: Payer: Self-pay | Admitting: Nurse Practitioner

## 2024-03-30 ENCOUNTER — Ambulatory Visit (INDEPENDENT_AMBULATORY_CARE_PROVIDER_SITE_OTHER): Payer: MEDICAID | Admitting: Nurse Practitioner

## 2024-03-30 VITALS — BP 133/83 | HR 94 | Temp 97.9°F | Wt 258.8 lb

## 2024-03-30 DIAGNOSIS — R079 Chest pain, unspecified: Secondary | ICD-10-CM

## 2024-03-30 DIAGNOSIS — Z87828 Personal history of other (healed) physical injury and trauma: Secondary | ICD-10-CM

## 2024-03-30 DIAGNOSIS — M25561 Pain in right knee: Secondary | ICD-10-CM | POA: Diagnosis not present
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
## 2024-04-05 ENCOUNTER — Ambulatory Visit (HOSPITAL_BASED_OUTPATIENT_CLINIC_OR_DEPARTMENT_OTHER): Payer: MEDICAID | Admitting: Student

## 2024-04-05 DIAGNOSIS — M25561 Pain in right knee: Secondary | ICD-10-CM | POA: Diagnosis not present

## 2024-04-05 DIAGNOSIS — G8929 Other chronic pain: Secondary | ICD-10-CM | POA: Diagnosis not present

## 2024-04-05 DIAGNOSIS — Z87828 Personal history of other (healed) physical injury and trauma: Secondary | ICD-10-CM | POA: Diagnosis not present
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
## 2024-04-06 ENCOUNTER — Ambulatory Visit (HOSPITAL_BASED_OUTPATIENT_CLINIC_OR_DEPARTMENT_OTHER): Payer: MEDICAID | Admitting: Orthopaedic Surgery

## 2024-04-06 ENCOUNTER — Other Ambulatory Visit (HOSPITAL_BASED_OUTPATIENT_CLINIC_OR_DEPARTMENT_OTHER): Payer: Self-pay

## 2024-04-06 ENCOUNTER — Ambulatory Visit (HOSPITAL_BASED_OUTPATIENT_CLINIC_OR_DEPARTMENT_OTHER): Payer: Self-pay | Admitting: Orthopaedic Surgery

## 2024-04-06 DIAGNOSIS — Z87828 Personal history of other (healed) physical injury and trauma: Secondary | ICD-10-CM | POA: Diagnosis not present

## 2024-04-06 MED ORDER — OXYCODONE HCL 5 MG PO TABS
5.0000 mg | ORAL_TABLET | ORAL | 0 refills | Status: DC | PRN
  Filled 2024-04-06: qty 5, 1d supply, fill #0

## 2024-04-06 MED ORDER — IBUPROFEN 800 MG PO TABS
800.0000 mg | ORAL_TABLET | Freq: Three times a day (TID) | ORAL | 0 refills | Status: AC
  Filled 2024-04-06: qty 30, 10d supply, fill #0

## 2024-04-06 MED ORDER — ACETAMINOPHEN 500 MG PO TABS
500.0000 mg | ORAL_TABLET | Freq: Three times a day (TID) | ORAL | 0 refills | Status: AC
Start: 2024-04-06 — End: 2024-04-18
  Filled 2024-04-06: qty 30, 10d supply, fill #0
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
## 2024-04-07 ENCOUNTER — Ambulatory Visit (HOSPITAL_BASED_OUTPATIENT_CLINIC_OR_DEPARTMENT_OTHER)
Admission: RE | Admit: 2024-04-07 | Discharge: 2024-04-07 | Disposition: A | Discharge status: Home / Self Care | Payer: MEDICAID | Source: Ambulatory Visit | Attending: Family Medicine | Admitting: Family Medicine

## 2024-04-07 ENCOUNTER — Encounter: Payer: Self-pay | Admitting: Family Medicine

## 2024-04-07 DIAGNOSIS — I10 Essential (primary) hypertension: Secondary | ICD-10-CM | POA: Diagnosis present

## 2024-04-11 ENCOUNTER — Telehealth: Payer: Self-pay | Admitting: Orthopaedic Surgery

## 2024-04-11 NOTE — Telephone Encounter (Signed)
 I called an advised patient anesthesia would be local (MAC).

## 2024-04-11 NOTE — Telephone Encounter (Signed)
 I spoke with the patient today and scheduling him for surgery on 05/02/24. Patient's surgery sheet indicated General Anesthesia for the procedure. Per patient, doctor did not mentioned "going under" for surgery. Patient is not comfortable having General Anesthesia. Patient is requesting doctor call him to discuss.

## 2024-04-28 ENCOUNTER — Other Ambulatory Visit: Payer: Self-pay

## 2024-04-28 ENCOUNTER — Encounter (HOSPITAL_COMMUNITY): Payer: Self-pay | Admitting: Orthopaedic Surgery

## 2024-04-28 NOTE — Progress Notes (Signed)
 PCP - Abbey Hobby, NP Cardiologist - denies  PPM/ICD - denies   Chest x-ray - 03/23/24 EKG - 03/24/24 Stress Test - denies ECHO - denies Cardiac Cath - denies  CPAP - denies  DM- denies  ASA/Blood Thinner Instructions: n/a   ERAS Protcol - clears until 1230  COVID TEST- n/a  Anesthesia review: no  Patient verbally denies any shortness of breath, fever, cough and chest pain during phone call      Questions were answered. Patient verbalized understanding of instructions.
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
## 2024-05-02 ENCOUNTER — Other Ambulatory Visit: Payer: Self-pay

## 2024-05-02 ENCOUNTER — Encounter (HOSPITAL_COMMUNITY): Payer: Self-pay | Admitting: Orthopaedic Surgery

## 2024-05-02 ENCOUNTER — Ambulatory Visit (HOSPITAL_COMMUNITY)
Admission: RE | Admit: 2024-05-02 | Discharge: 2024-05-02 | Disposition: A | Discharge status: Home / Self Care | Payer: MEDICAID | Attending: Orthopaedic Surgery | Admitting: Orthopaedic Surgery

## 2024-05-02 ENCOUNTER — Ambulatory Visit (HOSPITAL_COMMUNITY): Payer: MEDICAID | Admitting: Anesthesiology

## 2024-05-02 ENCOUNTER — Encounter (HOSPITAL_COMMUNITY)
Admission: RE | Disposition: A | Discharge status: Home / Self Care | Payer: Self-pay | Source: Home / Self Care | Attending: Orthopaedic Surgery

## 2024-05-02 DIAGNOSIS — S80251A Superficial foreign body, right knee, initial encounter: Secondary | ICD-10-CM

## 2024-05-02 DIAGNOSIS — Z87891 Personal history of nicotine dependence: Secondary | ICD-10-CM | POA: Insufficient documentation

## 2024-05-02 DIAGNOSIS — Z87828 Personal history of other (healed) physical injury and trauma: Secondary | ICD-10-CM

## 2024-05-02 DIAGNOSIS — F418 Other specified anxiety disorders: Secondary | ICD-10-CM | POA: Diagnosis not present

## 2024-05-02 DIAGNOSIS — F319 Bipolar disorder, unspecified: Secondary | ICD-10-CM | POA: Insufficient documentation

## 2024-05-02 DIAGNOSIS — M795 Residual foreign body in soft tissue: Secondary | ICD-10-CM | POA: Diagnosis present

## 2024-05-02 DIAGNOSIS — F17210 Nicotine dependence, cigarettes, uncomplicated: Secondary | ICD-10-CM | POA: Diagnosis not present

## 2024-05-02 DIAGNOSIS — I10 Essential (primary) hypertension: Secondary | ICD-10-CM | POA: Insufficient documentation

## 2024-05-02 HISTORY — PX: FOREIGN BODY REMOVAL: SHX962

## 2024-05-02 LAB — BASIC METABOLIC PANEL WITH GFR
Anion gap: 14 (ref 5–15)
BUN: 17 mg/dL (ref 6–20)
CO2: 24 mmol/L (ref 22–32)
Calcium: 9.4 mg/dL (ref 8.9–10.3)
Chloride: 98 mmol/L (ref 98–111)
Creatinine, Ser: 0.99 mg/dL (ref 0.61–1.24)
GFR, Estimated: >60 mL/min (ref >60)
Glucose, Bld: 101 mg/dL — ABNORMAL HIGH (ref 70–99)
Potassium: 3.1 mmol/L — ABNORMAL LOW (ref 3.5–5.1)
Sodium: 136 mmol/L (ref 135–145)

## 2024-05-02 LAB — CBC
HCT: 49.3 % (ref 39.0–52.0)
Hemoglobin: 17 g/dL (ref 13.0–17.0)
MCH: 31 pg (ref 26.0–34.0)
MCHC: 34.5 g/dL (ref 30.0–36.0)
MCV: 89.8 fL (ref 80.0–100.0)
Platelets: 223 10*3/uL (ref 150–400)
RBC: 5.49 MIL/uL (ref 4.22–5.81)
RDW: 12.8 % (ref 11.5–15.5)
WBC: 7.2 10*3/uL (ref 4.0–10.5)
nRBC: 0 % (ref 0.0–0.2)

## 2024-05-02 SURGERY — REMOVAL FOREIGN BODY EXTREMITY
Anesthesia: Monitor Anesthesia Care | Site: Knee | Laterality: Right

## 2024-05-02 MED ORDER — CEFAZOLIN SODIUM-DEXTROSE 2-4 GM/100ML-% IV SOLN
2.0000 g | INTRAVENOUS | Status: AC
  Administered 2024-05-02: 2 g via INTRAVENOUS

## 2024-05-02 MED ORDER — OXYCODONE HCL 5 MG PO TABS
ORAL_TABLET | ORAL | Status: AC
  Filled 2024-05-02: qty 1

## 2024-05-02 MED ORDER — OXYCODONE HCL 5 MG/5ML PO SOLN: 5.0000 mg | Freq: Once | ORAL | Status: AC | PRN

## 2024-05-02 MED ORDER — BUPIVACAINE HCL (PF) 0.25 % IJ SOLN
INTRAMUSCULAR | Status: AC
  Filled 2024-05-02: qty 30

## 2024-05-02 MED ORDER — PROPOFOL 500 MG/50ML IV EMUL
INTRAVENOUS | Status: DC | PRN
  Administered 2024-05-02: 75 ug/kg/min via INTRAVENOUS

## 2024-05-02 MED ORDER — ONDANSETRON HCL 4 MG/2ML IJ SOLN
INTRAMUSCULAR | Status: DC | PRN
  Administered 2024-05-02: 4 mg via INTRAVENOUS

## 2024-05-02 MED ORDER — LIDOCAINE 2% (20 MG/ML) 5 ML SYRINGE
INTRAMUSCULAR | Status: DC | PRN
  Administered 2024-05-02: 40 mg via INTRAVENOUS

## 2024-05-02 MED ORDER — CHLORHEXIDINE GLUCONATE 0.12 % MT SOLN
OROMUCOSAL | Status: AC
  Administered 2024-05-02: 15 mL via OROMUCOSAL
  Filled 2024-05-02: qty 15

## 2024-05-02 MED ORDER — MIDAZOLAM HCL 2 MG/2ML IJ SOLN
INTRAMUSCULAR | Status: DC | PRN
  Administered 2024-05-02: 2 mg via INTRAVENOUS

## 2024-05-02 MED ORDER — TRANEXAMIC ACID-NACL 1000-0.7 MG/100ML-% IV SOLN
1000.0000 mg | INTRAVENOUS | Status: AC
Start: 2024-05-02 — End: 2024-05-02
  Administered 2024-05-02: 1000 mg via INTRAVENOUS
  Filled 2024-05-02: qty 100

## 2024-05-02 MED ORDER — ACETAMINOPHEN 500 MG PO TABS
1000.0000 mg | ORAL_TABLET | Freq: Once | ORAL | Status: AC
Start: 2024-05-02 — End: 2024-05-02
  Administered 2024-05-02: 1000 mg via ORAL
  Filled 2024-05-02: qty 2

## 2024-05-02 MED ORDER — ORAL CARE MOUTH RINSE: 15.0000 mL | Freq: Once | OROMUCOSAL | Status: AC

## 2024-05-02 MED ORDER — SODIUM CHLORIDE 0.9 % IV SOLN: 12.5000 mg | INTRAVENOUS | Status: DC | PRN

## 2024-05-02 MED ORDER — LACTATED RINGERS IV SOLN: INTRAVENOUS | Status: DC

## 2024-05-02 MED ORDER — GABAPENTIN 300 MG PO CAPS
300.0000 mg | ORAL_CAPSULE | Freq: Once | ORAL | Status: AC
  Administered 2024-05-02: 300 mg via ORAL
  Filled 2024-05-02: qty 1

## 2024-05-02 MED ORDER — MIDAZOLAM HCL 2 MG/2ML IJ SOLN
INTRAMUSCULAR | Status: AC
  Filled 2024-05-02: qty 2

## 2024-05-02 MED ORDER — FENTANYL CITRATE (PF) 250 MCG/5ML IJ SOLN
INTRAMUSCULAR | Status: AC
  Filled 2024-05-02: qty 5

## 2024-05-02 MED ORDER — PROPOFOL 10 MG/ML IV BOLUS
INTRAVENOUS | Status: DC | PRN
  Administered 2024-05-02: 70 mg via INTRAVENOUS
  Administered 2024-05-02: 20 mg via INTRAVENOUS

## 2024-05-02 MED ORDER — FENTANYL CITRATE (PF) 250 MCG/5ML IJ SOLN
INTRAMUSCULAR | Status: DC | PRN
  Administered 2024-05-02 (×3): 50 ug via INTRAVENOUS

## 2024-05-02 MED ORDER — OXYCODONE HCL 5 MG PO TABS
5.0000 mg | ORAL_TABLET | Freq: Once | ORAL | Status: AC | PRN
  Administered 2024-05-02: 5 mg via ORAL

## 2024-05-02 MED ORDER — FENTANYL CITRATE (PF) 100 MCG/2ML IJ SOLN: 25.0000 ug | INTRAMUSCULAR | Status: DC | PRN

## 2024-05-02 MED ORDER — CHLORHEXIDINE GLUCONATE 0.12 % MT SOLN: 15.0000 mL | Freq: Once | OROMUCOSAL | Status: AC

## 2024-05-02 MED ORDER — BUPIVACAINE HCL (PF) 0.25 % IJ SOLN
INTRAMUSCULAR | Status: DC | PRN
  Administered 2024-05-02: 10 mL

## 2024-05-02 MED ORDER — CEFAZOLIN SODIUM-DEXTROSE 2-4 GM/100ML-% IV SOLN
INTRAVENOUS | Status: AC
  Filled 2024-05-02: qty 100

## 2024-05-02 SURGICAL SUPPLY — 24 items
BAG COUNTER SPONGE SURGICOUNT (BAG) ×1 IMPLANT
BLADE SURG 10 STRL SS (BLADE) ×1 IMPLANT
BNDG COHESIVE 4X5 TAN STRL LF (GAUZE/BANDAGES/DRESSINGS) ×1 IMPLANT
CHLORAPREP W/TINT 26 (MISCELLANEOUS) ×1 IMPLANT
CLSR STERI-STRIP ANTIMIC 1/2X4 (GAUZE/BANDAGES/DRESSINGS) IMPLANT
COVER SURGICAL LIGHT HANDLE (MISCELLANEOUS) ×2 IMPLANT
DERMABOND ADVANCED .7 DNX12 (GAUZE/BANDAGES/DRESSINGS) IMPLANT
DRESSING MEPILEX FLEX 4X4 (GAUZE/BANDAGES/DRESSINGS) IMPLANT
GLOVE BIOGEL PI IND STRL 7.5 (GLOVE) ×1 IMPLANT
GOWN STRL REUS W/ TWL LRG LVL3 (GOWN DISPOSABLE) ×2 IMPLANT
KIT BASIN OR (CUSTOM PROCEDURE TRAY) ×1 IMPLANT
KIT TURNOVER KIT B (KITS) ×1 IMPLANT
MANIFOLD NEPTUNE II (INSTRUMENTS) ×1 IMPLANT
NS IRRIG 1000ML POUR BTL (IV SOLUTION) ×1 IMPLANT
PACK ORTHO EXTREMITY (CUSTOM PROCEDURE TRAY) ×1 IMPLANT
SUT ETHILON 3 0 PS 1 (SUTURE) IMPLANT
SUT MNCRL AB 3-0 PS2 18 (SUTURE) IMPLANT
SUT PDS AB 2-0 CT1 27 (SUTURE) IMPLANT
TOWEL GREEN STERILE (TOWEL DISPOSABLE) ×2 IMPLANT
TOWEL GREEN STERILE FF (TOWEL DISPOSABLE) ×1 IMPLANT
TUBE CONNECTING 12X1/4 (SUCTIONS) ×1 IMPLANT
UNDERPAD 30X36 HEAVY ABSORB (UNDERPADS AND DIAPERS) ×1 IMPLANT
WATER STERILE IRR 1000ML POUR (IV SOLUTION) ×1 IMPLANT
YANKAUER SUCT BULB TIP NO VENT (SUCTIONS) ×1 IMPLANT

## 2024-05-02 NOTE — Anesthesia Preprocedure Evaluation (Signed)
 Anesthesia Evaluation  Patient identified by MRN, date of birth, ID band Patient awake    Reviewed: Allergy & Precautions, H&P , NPO status , Patient's Chart, lab work & pertinent test results  Airway Mallampati: II  TM Distance: >3 FB Neck ROM: Full    Dental no notable dental hx.    Pulmonary neg pulmonary ROS, Current Smoker and Patient abstained from smoking.   Pulmonary exam normal breath sounds clear to auscultation       Cardiovascular hypertension, Pt. on medications negative cardio ROS Normal cardiovascular exam Rhythm:Regular Rate:Normal     Neuro/Psych  Headaches  Anxiety Depression Bipolar Disorder Schizophrenia   negative psych ROS   GI/Hepatic negative GI ROS, Neg liver ROS,,,  Endo/Other  negative endocrine ROS    Renal/GU negative Renal ROS  negative genitourinary   Musculoskeletal negative musculoskeletal ROS (+)    Abdominal  (+) + obese  Peds negative pediatric ROS (+)  Hematology negative hematology ROS (+)   Anesthesia Other Findings   Reproductive/Obstetrics negative OB ROS                             Anesthesia Physical Anesthesia Plan  ASA: 3  Anesthesia Plan: MAC   Post-op Pain Management: Minimal or no pain anticipated   Induction: Intravenous  PONV Risk Score and Plan: 1 and Propofol  infusion and Treatment may vary due to age or medical condition  Airway Management Planned: Simple Face Mask  Additional Equipment:   Intra-op Plan:   Post-operative Plan:   Informed Consent: I have reviewed the patients History and Physical, chart, labs and discussed the procedure including the risks, benefits and alternatives for the proposed anesthesia with the patient or authorized representative who has indicated his/her understanding and acceptance.     Dental advisory given  Plan Discussed with: CRNA  Anesthesia Plan Comments:        Anesthesia Quick  Evaluation
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
## 2024-05-02 NOTE — Transfer of Care (Signed)
 Immediate Anesthesia Transfer of Care Note  Patient: Joseph Bautista  Procedure(s) Performed: REMOVAL FOREIGN BODY EXTREMITY (Right: Knee)  Patient Location: PACU  Anesthesia Type:MAC  Level of Consciousness: sedated  Airway & Oxygen Therapy: Patient Spontanous Breathing  Post-op Assessment: Report given to RN  Post vital signs: Reviewed and stable  Last Vitals:  Vitals Value Taken Time  BP 127/85 05/02/24 1615  Temp 36.6 C 05/02/24 1615  Pulse 85 05/02/24 1617  Resp 12 05/02/24 1617  SpO2 95 % 05/02/24 1617  Vitals shown include unfiled device data.  Last Pain:  Vitals:   05/02/24 1339  TempSrc:   PainSc: 0-No pain         Complications: No notable events documented.
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
## 2024-05-03 ENCOUNTER — Encounter (HOSPITAL_COMMUNITY): Payer: Self-pay | Admitting: Orthopaedic Surgery

## 2024-05-03 ENCOUNTER — Telehealth: Payer: Self-pay | Admitting: Orthopaedic Surgery

## 2024-05-03 ENCOUNTER — Other Ambulatory Visit: Payer: Self-pay | Admitting: Orthopaedic Surgery

## 2024-05-03 MED ORDER — OXYCODONE HCL 5 MG PO TABS: 5.0000 mg | ORAL_TABLET | ORAL | 0 refills | Status: AC | PRN

## 2024-05-03 NOTE — Telephone Encounter (Signed)
 Pt's wife Torrie Frei called requesting refill of oxycodone . Please send to [facility] on [address]. Pt asked if they can get another bandage to cover incision. Please call pt about this matter at [phone].

## 2024-05-03 NOTE — Anesthesia Postprocedure Evaluation (Signed)
 Anesthesia Post Note  Patient: Joseph Bautista  Procedure(s) Performed: REMOVAL FOREIGN BODY EXTREMITY (Right: Knee)     Patient location during evaluation: PACU Anesthesia Type: MAC Level of consciousness: awake and alert Pain management: pain level controlled Vital Signs Assessment: post-procedure vital signs reviewed and stable Respiratory status: spontaneous breathing, nonlabored ventilation and respiratory function stable Cardiovascular status: blood pressure returned to baseline and stable Postop Assessment: no apparent nausea or vomiting Anesthetic complications: no   No notable events documented.  Last Vitals:  Vitals:   05/02/24 1645 05/02/24 1700  BP: (!) 132/94 (!) 129/93  Pulse: 71 83  Resp: 14 14  Temp:  36.5 C  SpO2: 96% 95%    Last Pain:  Vitals:   05/02/24 1643  TempSrc:   PainSc: 4    Pain Goal:                   [name]

## 2024-05-03 NOTE — Telephone Encounter (Signed)
 I talked to the wife. She stated understanding. She would like his bandage looked at in the morning. I advised I would put him down for a nurse visit to look at it

## 2024-05-04 ENCOUNTER — Ambulatory Visit (INDEPENDENT_AMBULATORY_CARE_PROVIDER_SITE_OTHER): Payer: MEDICAID | Admitting: Student

## 2024-05-04 ENCOUNTER — Encounter (HOSPITAL_BASED_OUTPATIENT_CLINIC_OR_DEPARTMENT_OTHER): Payer: Self-pay | Admitting: Student

## 2024-05-04 DIAGNOSIS — Z87828 Personal history of other (healed) physical injury and trauma: Secondary | ICD-10-CM

## 2024-05-04 DIAGNOSIS — W3400XA Accidental discharge from unspecified firearms or gun, initial encounter: Secondary | ICD-10-CM

## 2024-05-04 NOTE — Progress Notes (Signed)
 Patient came in today for a bandage change. No redness, fever or chills. New bandage placed and patient will follow-up as scheduled

## 2024-05-13 ENCOUNTER — Ambulatory Visit (INDEPENDENT_AMBULATORY_CARE_PROVIDER_SITE_OTHER): Payer: MEDICAID | Admitting: Orthopaedic Surgery

## 2024-05-13 DIAGNOSIS — G8929 Other chronic pain: Secondary | ICD-10-CM

## 2024-05-13 DIAGNOSIS — W3400XA Accidental discharge from unspecified firearms or gun, initial encounter: Secondary | ICD-10-CM
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
## 2024-05-17 ENCOUNTER — Emergency Department (HOSPITAL_BASED_OUTPATIENT_CLINIC_OR_DEPARTMENT_OTHER)
Admission: EM | Admit: 2024-05-17 | Discharge: 2024-05-17 | Disposition: A | Discharge status: Home / Self Care | Payer: MEDICAID | Attending: Emergency Medicine | Admitting: Emergency Medicine

## 2024-05-17 ENCOUNTER — Emergency Department (HOSPITAL_BASED_OUTPATIENT_CLINIC_OR_DEPARTMENT_OTHER): Payer: MEDICAID

## 2024-05-17 ENCOUNTER — Encounter (HOSPITAL_BASED_OUTPATIENT_CLINIC_OR_DEPARTMENT_OTHER): Payer: Self-pay

## 2024-05-17 DIAGNOSIS — I10 Essential (primary) hypertension: Secondary | ICD-10-CM | POA: Insufficient documentation

## 2024-05-17 DIAGNOSIS — Z7982 Long term (current) use of aspirin: Secondary | ICD-10-CM | POA: Insufficient documentation

## 2024-05-17 DIAGNOSIS — R519 Headache, unspecified: Secondary | ICD-10-CM | POA: Diagnosis present

## 2024-05-17 DIAGNOSIS — Z79899 Other long term (current) drug therapy: Secondary | ICD-10-CM | POA: Insufficient documentation

## 2024-05-17 MED ORDER — ONDANSETRON 4 MG PO TBDP
8.0000 mg | ORAL_TABLET | Freq: Once | ORAL | Status: AC
  Administered 2024-05-17: 8 mg via ORAL
  Filled 2024-05-17: qty 2

## 2024-05-17 MED ORDER — KETOROLAC TROMETHAMINE 30 MG/ML IJ SOLN
30.0000 mg | Freq: Once | INTRAMUSCULAR | Status: AC
  Administered 2024-05-17: 30 mg via INTRAMUSCULAR
  Filled 2024-05-17: qty 1

## 2024-05-17 MED ORDER — KETOROLAC TROMETHAMINE 30 MG/ML IJ SOLN: 30.0000 mg | Freq: Once | INTRAMUSCULAR | Status: DC

## 2024-05-17 MED ORDER — IBUPROFEN 400 MG PO TABS
400.0000 mg | ORAL_TABLET | Freq: Once | ORAL | Status: AC | PRN
  Administered 2024-05-17: 400 mg via ORAL
  Filled 2024-05-17: qty 1

## 2024-05-17 MED ORDER — PROCHLORPERAZINE EDISYLATE 10 MG/2ML IJ SOLN: 10.0000 mg | Freq: Once | INTRAMUSCULAR | Status: DC
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
## 2024-05-17 NOTE — Discharge Instructions (Signed)
 Take over-the-counter medications such as Tylenol  or ibuprofen  for your headache.  You can also try a decongestant medication such as Sudafed if you continue to have the pressure sensation.
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
## 2024-05-18 ENCOUNTER — Telehealth: Payer: Self-pay

## 2024-05-18 NOTE — Transitions of Care (Post Inpatient/ED Visit) (Signed)
 05/18/2024  Name: Joseph Bautista MRN: [id number] DOB: [birth year]  Today's TOC FU Call Status:   Patient's Name and Date of Birth confirmed.  Transition Care Management Follow-up Telephone Call Discharge Facility: [facility] ([facility]) Type of Discharge: Emergency Department Reason for ED Visit: Other: How have you been since you were released from the hospital?: Better Any questions or concerns?: No  Items Reviewed: Did you receive and understand the discharge instructions provided?: Yes Medications obtained,verified, and reconciled?: Yes (Medications Reviewed) Any new allergies since your discharge?: No Dietary orders reviewed?: No Do you have support at home?: Yes People in Home [RPT]: significant other  Medications Reviewed Today: Medications Reviewed Today     Reviewed by Angelita Bares, CMA (Certified Medical Assistant) on 05/18/24 at [phone]  Med List Status: <None>   Medication Order Taking? Sig Documenting Provider Last Dose Status Informant  acetaminophen  (TYLENOL ) 500 MG tablet [id number] Yes Take 500 mg by mouth every 6 (six) hours as needed for moderate pain (pain score 4-6). [provider] Taking Active Self  albuterol  (VENTOLIN  HFA) 108 (90 Base) MCG/ACT inhaler [id number] Yes Inhale 2 puffs into the lungs every 6 (six) hours as needed for wheezing or shortness of breath (Cough, and/or prior to all physical activity). Jerrlyn Morel, NP Taking Active Self  amLODipine  (NORVASC ) 10 MG tablet [id number] Yes Take 1 tablet (10 mg total) by mouth daily. Catheryn Cluck, MD Taking Active Self  aspirin  EC 81 MG tablet [id number] Yes Take 1 tablet (81 mg total) by mouth daily. Swallow whole. Catheryn Cluck, MD Taking Active Self  ibuprofen  (ADVIL ) 800 MG tablet [id number] Yes Take 800 mg by mouth every 8 (eight) hours as needed for moderate pain (pain score 4-6). [provider] Taking Active Self  indapamide  (LOZOL ) 2.5 MG tablet [id number] Yes  Take 1 tablet (2.5 mg total) by mouth daily. Catheryn Cluck, MD Taking Active Self  lidocaine  (LIDODERM ) 5 % [id number]  Place 1 patch onto the skin daily. Remove & Discard patch within 12 hours or as directed by MD  Patient not taking: Reported on 04/22/2024   Keith, Kayla N, PA-C  Active Self  Multiple Vitamin (MULTIVITAMIN WITH MINERALS) TABS tablet [id number] Yes Take 1 tablet by mouth daily. [provider] Taking Active Self  omeprazole  (PRILOSEC) 20 MG capsule [id number] Yes Take 1 capsule (20 mg total) by mouth daily. Lowery Rue, DO Taking Active Self  oxyCODONE  (ROXICODONE ) 5 MG immediate release tablet [id number] Yes Take 1 tablet (5 mg total) by mouth every 4 (four) hours as needed for severe pain (pain score 7-10) or breakthrough pain. Wilhelmenia Harada, MD Taking Active     Discontinued 09/02/19 1112          Med Note Maxene Span   Fri May 06, 2019 11:15 AM) As needed.             Home Care and Equipment/Supplies: Were Home Health Services Ordered?: No Any new equipment or medical supplies ordered?: No  Functional Questionnaire: Do you need assistance with bathing/showering or dressing?: No Do you need assistance with meal preparation?: No Do you need assistance with eating?: No Do you have difficulty maintaining continence: No Do you need assistance with getting out of bed/getting out of a chair/moving?: No Do you have difficulty managing or taking your medications?: No  Follow up appointments reviewed: PCP Follow-up appointment confirmed?: Yes Date of PCP follow-up appointment?: 06/02/24 Follow-up Provider: [facility] Follow-up appointment confirmed?: NA Do you  need transportation to your follow-up appointment?: No Do you understand care options if your condition(s) worsen?: Yes-patient verbalized understanding    SIGNATURE Kimbery Harwood, RMA

## 2024-06-02 ENCOUNTER — Encounter (HOSPITAL_BASED_OUTPATIENT_CLINIC_OR_DEPARTMENT_OTHER): Payer: Self-pay | Admitting: Family Medicine

## 2024-06-02 ENCOUNTER — Other Ambulatory Visit (HOSPITAL_BASED_OUTPATIENT_CLINIC_OR_DEPARTMENT_OTHER): Payer: Self-pay

## 2024-06-02 ENCOUNTER — Ambulatory Visit (INDEPENDENT_AMBULATORY_CARE_PROVIDER_SITE_OTHER): Payer: MEDICAID | Admitting: Family Medicine

## 2024-06-02 VITALS — BP 113/66 | HR 103 | Ht 74.0 in | Wt 257.4 lb

## 2024-06-02 DIAGNOSIS — J312 Chronic pharyngitis: Secondary | ICD-10-CM | POA: Diagnosis not present

## 2024-06-02 DIAGNOSIS — Z0000 Encounter for general adult medical examination without abnormal findings: Secondary | ICD-10-CM

## 2024-06-02 DIAGNOSIS — K219 Gastro-esophageal reflux disease without esophagitis: Secondary | ICD-10-CM | POA: Diagnosis not present

## 2024-06-02 DIAGNOSIS — I10 Essential (primary) hypertension: Secondary | ICD-10-CM

## 2024-06-02 MED ORDER — ALBUTEROL SULFATE HFA 108 (90 BASE) MCG/ACT IN AERS
2.0000 | INHALATION_SPRAY | Freq: Four times a day (QID) | RESPIRATORY_TRACT | 2 refills | Status: DC | PRN
  Filled 2024-06-02: qty 18, 30d supply, fill #0

## 2024-06-02 MED ORDER — AMLODIPINE BESYLATE 10 MG PO TABS
10.0000 mg | ORAL_TABLET | Freq: Every day | ORAL | 1 refills | Status: DC
  Filled 2024-06-02: qty 90, 90d supply, fill #0
  Filled 2024-08-31: qty 90, 90d supply, fill #1

## 2024-06-02 MED ORDER — OMEPRAZOLE 20 MG PO CPDR
20.0000 mg | DELAYED_RELEASE_CAPSULE | Freq: Every day | ORAL | 1 refills | Status: AC
Start: 2024-06-02 — End: ?
  Filled 2024-06-02: qty 90, 90d supply, fill #0
  Filled 2024-11-24: qty 90, 90d supply, fill #1

## 2024-06-02 MED ORDER — LORATADINE 10 MG PO TABS
10.0000 mg | ORAL_TABLET | Freq: Every day | ORAL | 1 refills | Status: AC
  Filled 2024-06-02: qty 30, 30d supply, fill #0
  Filled 2024-11-24: qty 30, 30d supply, fill #1
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
## 2024-06-02 NOTE — Assessment & Plan Note (Signed)
 Blood pressure appropriate in office today.  He has only taken amlodipine  so far today and when taking indapamide , indicates that he will occasionally have chest pain if taking it with amlodipine . We discussed options and we can move forward with utilizing amlodipine  alone.  Advised on monitoring blood pressure intermittently with this change and if blood pressure begins to run higher, may need to resume indapamide , but at lower dose or start with alternative medication at lower dose We will follow-up on progress at next appointment

## 2024-06-02 NOTE — Patient Instructions (Signed)
  Medication Instructions:  Your physician recommends that you continue on your current medications as directed. Please refer to the Current Medication list given to you today. --If you need a refill on any your medications before your next appointment, please call your pharmacy first. If no refills are authorized on file call the office.-- Lab Work: Your physician has recommended that you have lab work today: 1 week before next visit  If you have labs (blood work) drawn today and your tests are completely normal, you will receive your results via MyChart message OR a phone call from our staff.  Please ensure you check your voicemail in the event that you authorized detailed messages to be left on a delegated number. If you have any lab test that is abnormal or we need to change your treatment, we will call you to review the results.   Follow-Up: Your next appointment:   Your physician recommends that you schedule a follow-up appointment in: 2 months physical with Dr. de Peru  You will receive a text message or e-mail with a link to a survey about your care and experience with [place] today! We would greatly appreciate your feedback!   Thanks for letting us be apart of your health journey!!  Primary Care and Sports Medicine   Dr. Ceasar Mons Peru   We encourage you to activate your patient portal called "MyChart".  Sign up information is provided on this After Visit Summary.  MyChart is used to connect with patients for Virtual Visits (Telemedicine).  Patients are able to view lab/test results, encounter notes, upcoming appointments, etc.  Non-urgent messages can be sent to your provider as well. To learn more about what you can do with MyChart, please visit --  [email or web address].

## 2024-06-02 NOTE — Assessment & Plan Note (Signed)
 Can continue with use of omeprazole , refill provided today.

## 2024-06-09 ENCOUNTER — Ambulatory Visit: Payer: Self-pay

## 2024-06-09 ENCOUNTER — Encounter (HOSPITAL_BASED_OUTPATIENT_CLINIC_OR_DEPARTMENT_OTHER): Payer: Self-pay | Admitting: Emergency Medicine

## 2024-06-09 ENCOUNTER — Emergency Department (HOSPITAL_BASED_OUTPATIENT_CLINIC_OR_DEPARTMENT_OTHER)
Admission: EM | Admit: 2024-06-09 | Discharge: 2024-06-09 | Disposition: A | Discharge status: Home / Self Care | Payer: MEDICAID | Attending: Emergency Medicine | Admitting: Emergency Medicine

## 2024-06-09 ENCOUNTER — Other Ambulatory Visit: Payer: Self-pay

## 2024-06-09 DIAGNOSIS — H9201 Otalgia, right ear: Secondary | ICD-10-CM | POA: Diagnosis not present

## 2024-06-09 DIAGNOSIS — R519 Headache, unspecified: Secondary | ICD-10-CM | POA: Diagnosis present

## 2024-06-09 DIAGNOSIS — I10 Essential (primary) hypertension: Secondary | ICD-10-CM | POA: Diagnosis not present

## 2024-06-09 DIAGNOSIS — Z79899 Other long term (current) drug therapy: Secondary | ICD-10-CM | POA: Insufficient documentation

## 2024-06-09 DIAGNOSIS — Z7982 Long term (current) use of aspirin: Secondary | ICD-10-CM | POA: Insufficient documentation

## 2024-06-09 DIAGNOSIS — F17200 Nicotine dependence, unspecified, uncomplicated: Secondary | ICD-10-CM | POA: Diagnosis not present

## 2024-06-09 LAB — CBC WITH DIFFERENTIAL/PLATELET
Abs Immature Granulocytes: 0.03 10*3/uL (ref 0.00–0.07)
Basophils Absolute: 0 10*3/uL (ref 0.0–0.1)
Basophils Relative: 0 %
Eosinophils Absolute: 0.1 10*3/uL (ref 0.0–0.5)
Eosinophils Relative: 1 %
HCT: 45.9 % (ref 39.0–52.0)
Hemoglobin: 15.8 g/dL (ref 13.0–17.0)
Immature Granulocytes: 0 %
Lymphocytes Relative: 28 %
Lymphs Abs: 1.9 10*3/uL (ref 0.7–4.0)
MCH: 30.7 pg (ref 26.0–34.0)
MCHC: 34.4 g/dL (ref 30.0–36.0)
MCV: 89.3 fL (ref 80.0–100.0)
Monocytes Absolute: 0.3 10*3/uL (ref 0.1–1.0)
Monocytes Relative: 5 %
Neutro Abs: 4.7 10*3/uL (ref 1.7–7.7)
Neutrophils Relative %: 66 %
Platelets: 225 10*3/uL (ref 150–400)
RBC: 5.14 MIL/uL (ref 4.22–5.81)
RDW: 13.4 % (ref 11.5–15.5)
WBC: 7 10*3/uL (ref 4.0–10.5)
nRBC: 0 % (ref 0.0–0.2)

## 2024-06-09 LAB — BASIC METABOLIC PANEL WITH GFR
Anion gap: 11 (ref 5–15)
BUN: 16 mg/dL (ref 6–20)
CO2: 26 mmol/L (ref 22–32)
Calcium: 9.6 mg/dL (ref 8.9–10.3)
Chloride: 100 mmol/L (ref 98–111)
Creatinine, Ser: 0.93 mg/dL (ref 0.61–1.24)
GFR, Estimated: >60 mL/min (ref >60)
Glucose, Bld: 90 mg/dL (ref 70–99)
Potassium: 3.9 mmol/L (ref 3.5–5.1)
Sodium: 137 mmol/L (ref 135–145)

## 2024-06-09 MED ORDER — DEXAMETHASONE SODIUM PHOSPHATE 10 MG/ML IJ SOLN
10.0000 mg | Freq: Once | INTRAMUSCULAR | Status: AC
  Administered 2024-06-09: 10 mg via INTRAVENOUS
  Filled 2024-06-09: qty 1

## 2024-06-09 NOTE — ED Triage Notes (Signed)
 Pt c/o headache and R ear pain ongoing for the past month which worsened since last night.

## 2024-06-09 NOTE — Discharge Instructions (Signed)
 If headache recurs at home, recommend Tylenol  1000 mg and/or ibuprofen  600 mg. Follow up with your primary care doctor for recheck and any further outpatient management of recurrent headache they feel are indicated.

## 2024-06-09 NOTE — ED Notes (Signed)
 Pt out of ED with steady gait not in visible distress.
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
## 2024-06-09 NOTE — Telephone Encounter (Signed)
 Copied from CRM [id number]. Topic: Clinical - Red Word Triage >> Jun 09, 2024  4:07 PM Tiffini S wrote: [facility] that prompted transfer to Nurse Triage: Patient is having severe migraines and right ear pain. Blood pressure reading was 145/94 this morning. Reason for Disposition  Patient sounds very sick or weak to the triager  Patient sounds very sick or weak to the  triager    Combination of patients s/s are concerning  Answer Assessment - Initial Assessment Questions 1. LOCATION: Where does it hurt?      ---Head    2. ONSET: When did the headache start? (Minutes, hours or days)      --- Today     3. PATTERN: Does the pain come and go, or has it been constant since it started?     ------------- Constant    4. SEVERITY: How bad is the pain? and What does it keep you from doing?  (e.g., Scale 1-10; mild, moderate, or severe)   - MILD (1-3): doesn't interfere with normal activities    - MODERATE (4-7): interferes with normal activities or awakens from sleep    - SEVERE (8-10): excruciating pain, unable to do any normal activities         ----------------------------5/10   5. RECURRENT SYMPTOM: Have you ever had headaches before? If Yes, ask: When was the last time? and What happened that time?      --- Yes, was given pseudoephedrine    6. CAUSE: what do you think is causing the headache?     ------   7. MIGRAINE: Have you been diagnosed with migraine headaches? If Yes, ask: Is this headache similar?      ---------- Denies    8. HEAD INJURY: Has there been any recent injury to the head?      ----------- Denies    9. OTHER SYMPTOMS: Do you have any other symptoms? (fever, stiff neck, eye pain, sore throat, cold symptoms)     --- R. Ear pain- Intermittent ------- 151/84 ( taken during the call) -------132/91 ( Taken during the call) ------ Dark urine ( X2) ----Patient confirms he has been outside in the heat for a long duration, working.  Answer  Assessment - Initial Assessment Questions 1. SYMPTOMS: What symptoms are you concerned about?     -- Headache, ear pressure and dark urine    2. ONSET:  When did the symptoms start?     ---- Today    3. FEVER: Do you have a fever? If Yes, ask: What is it, how was it measured, and when did it start?      ------Denies    4. HEAT EXPOSURE: What caused you to become hot? (e.g., outside in the sun, inside a hot room)     ------  Patient reports being outside for quite some time.   5. PHYSICAL ACTIVITY:  What type of physical activity were you doing? (e.g., working, sports)     --- Working    6. SICK: Have you been sick recently? (e.g., cold, flu, recent fever)     --Denies    7. OTHER SYMPTOMS: Do you have any other symptoms? (e.g., fainting, flushed skin, weakness, nausea, vomiting, muscle cramps) ------Denies  Protocols used: Headache-A-AH, Heat Exposure (Heat Exhaustion and Heat Stroke)-A-AH

## 2024-06-13 ENCOUNTER — Telehealth: Payer: Self-pay

## 2024-06-13 NOTE — Transitions of Care (Post Inpatient/ED Visit) (Signed)
   06/13/2024  Name: HORATIO BERTZ MRN: [id number] DOB: 1986/01/23  Today's TOC FU Call Status: Today's TOC FU Call Status:: Unsuccessful Call (1st Attempt) Unsuccessful Call (1st Attempt) Date: 06/13/24  Attempted to reach the patient regarding the most recent Inpatient/ED visit.  Follow Up Plan: Additional outreach attempts will be made to reach the patient to complete the Transitions of Care (Post Inpatient/ED visit) call.   Signature  [employer], [place]

## 2024-06-14 ENCOUNTER — Telehealth: Payer: Self-pay

## 2024-06-14 NOTE — Transitions of Care (Post Inpatient/ED Visit) (Signed)
   06/14/2024  Name: Joseph Bautista MRN: [id number] DOB: Apr 28, 1986  Today's TOC FU Call Status:   Patient's Name and Date of Birth confirmed.  Transition Care Management Follow-up Telephone Call Date of Discharge: 06/09/24 Discharge Facility: [facility] ([facility]) Type of Discharge: Emergency Department Reason for ED Visit: Respiratory How have you been since you were released from the hospital?: Better Any questions or concerns?: No  Items Reviewed: Did you receive and understand the discharge instructions provided?: No Medications obtained,verified, and reconciled?: Yes (Medications Reviewed) Any new allergies since your discharge?: No Dietary orders reviewed?: No Do you have support at home?: Yes  Medications Reviewed Today: Medications Reviewed Today     Reviewed by Starlene Charlynn BIRCH, CMA (Certified Medical Assistant) on 06/14/24 at 0912  Med List Status: <None>   Medication Order Taking? Sig Documenting Provider Last Dose Status Informant  acetaminophen  (TYLENOL ) 500 MG tablet [id number] Yes Take 500 mg by mouth every 6 (six) hours as needed for moderate pain (pain score 4-6). [provider]  Active Self  albuterol  (VENTOLIN  HFA) 108 (90 Base) MCG/ACT inhaler [id number] Yes Inhale 2 puffs into the lungs every 6 (six) hours as needed for wheezing or shortness of breath (Cough, and/or prior to all physical activity). de [place], Raymond J, MD  Active   amLODipine  (NORVASC ) 10 MG tablet [id number] Yes Take 1 tablet (10 mg total) by mouth daily. de [place], Raymond J, MD  Active   aspirin  EC 81 MG tablet [id number] Yes Take 1 tablet (81 mg total) by mouth daily. Swallow whole. Sebastian Beverley NOVAK, MD  Active Self  ibuprofen  (ADVIL ) 800 MG tablet [id number] Yes Take 800 mg by mouth every 8 (eight) hours as needed for moderate pain (pain score 4-6). [provider]  Active Self  loratadine  (CLARITIN ) 10 MG tablet [id number] Yes Take 1 tablet (10 mg total) by mouth  daily. de [place], Raymond J, MD  Active   Multiple Vitamin (MULTIVITAMIN WITH MINERALS) TABS tablet [id number] Yes Take 1 tablet by mouth daily. [provider]  Active Self  omeprazole  (PRILOSEC) 20 MG capsule [id number] Yes Take 1 capsule (20 mg total) by mouth daily. de [place], Quintin PARAS, MD  Active   oxyCODONE  (ROXICODONE ) 5 MG immediate release tablet [id number] Yes Take 1 tablet (5 mg total) by mouth every 4 (four) hours as needed for severe pain (pain score 7-10) or breakthrough pain. Genelle Standing, MD  Active     Discontinued 09/02/19 1112          Med Note ARCHIE LANETA HERO   Fri May 06, 2019 11:15 AM) As needed.             Home Care and Equipment/Supplies: Were Home Health Services Ordered?: NA Any new equipment or medical supplies ordered?: NA  Functional Questionnaire: Do you need assistance with bathing/showering or dressing?: No Do you need assistance with meal preparation?: No Do you need assistance with eating?: No Do you have difficulty maintaining continence: No Do you need assistance with getting out of bed/getting out of a chair/moving?: No Do you have difficulty managing or taking your medications?: No  Follow up appointments reviewed: PCP Follow-up appointment confirmed?: NA Specialist Hospital Follow-up appointment confirmed?: NA Do you need transportation to your follow-up appointment?: No Do you understand care options if your condition(s) worsen?: Yes-patient verbalized understanding    SIGNATURE Ionna Avis, RMA

## 2024-07-04 ENCOUNTER — Ambulatory Visit: Payer: Self-pay

## 2024-07-04 NOTE — Telephone Encounter (Signed)
 FYI Only or Action Required?: FYI only for provider.  Patient was last seen in primary care on 06/02/2024 by de [place], Quintin PARAS, MD.  Called Nurse Triage reporting Abdominal Pain.  Symptoms began 2 days ago.  Interventions attempted: Nothing.  Symptoms are: gradually worsening.  Triage Disposition: See Physician Within 24 Hours  Patient/caregiver understands and will follow disposition?: No appointment, going to urgent care    Copied from CRM [id number]. Topic: Clinical - Red Word Triage >> Jul 04, 2024  2:58 PM [name] wrote: Pt has stomach pain at a 6 ad says just feels weird     Reason for Disposition  [1] MODERATE pain (e.g., interferes with normal activities) AND [2] pain comes and goes (cramps) AND [3] present > 24 hours  (Exception: Pain with Vomiting or Diarrhea - see that Guideline.)  Answer Assessment - Initial Assessment Questions 1. LOCATION: Where does it hurt?      Lower abdomen  2. RADIATION: Does the pain shoot anywhere else? (e.g., chest, back)     No 3. ONSET: When did the pain begin? (Minutes, hours or days ago)      2 days ago  4. SUDDEN: Gradual or sudden onset?     Sudden  5. PATTERN Does the pain come and go, or is it constant?      Intermittent  6. SEVERITY: How bad is the pain?  (e.g., Scale 1-10; mild, moderate, or severe)     6/10 7. RECURRENT SYMPTOM: Have you ever had this type of stomach pain before? If Yes, ask: When was the last time? and What happened that time?      No 8. CAUSE: What do you think is causing the stomach pain? (e.g., gallstones, recent abdominal surgery)     Unsure  10. OTHER SYMPTOMS: Do you have any other symptoms? (e.g., back pain, diarrhea, fever, urination pain, vomiting)       No  Protocols used: Abdominal Pain - Male-A-AH

## 2024-07-05 ENCOUNTER — Ambulatory Visit (HOSPITAL_COMMUNITY)
Admission: EM | Admit: 2024-07-05 | Discharge: 2024-07-05 | Disposition: A | Discharge status: Home / Self Care | Payer: MEDICAID | Attending: Family Medicine | Admitting: Family Medicine

## 2024-07-05 ENCOUNTER — Encounter (HOSPITAL_COMMUNITY): Payer: Self-pay

## 2024-07-05 DIAGNOSIS — Z113 Encounter for screening for infections with a predominantly sexual mode of transmission: Secondary | ICD-10-CM | POA: Insufficient documentation

## 2024-07-05 DIAGNOSIS — R21 Rash and other nonspecific skin eruption: Secondary | ICD-10-CM | POA: Insufficient documentation

## 2024-07-05 LAB — HIV ANTIBODY (ROUTINE TESTING W REFLEX): HIV Screen 4th Generation wRfx: NONREACTIVE

## 2024-07-05 MED ORDER — DEXAMETHASONE SODIUM PHOSPHATE 10 MG/ML IJ SOLN
INTRAMUSCULAR | Status: AC
  Filled 2024-07-05: qty 1

## 2024-07-05 MED ORDER — DEXAMETHASONE SODIUM PHOSPHATE 10 MG/ML IJ SOLN
10.0000 mg | Freq: Once | INTRAMUSCULAR | Status: AC
  Administered 2024-07-05: 10 mg via INTRAMUSCULAR

## 2024-07-05 NOTE — ED Triage Notes (Signed)
 Pt states itchy rash to his back since last night. Also states he would like to be tested for STD's.

## 2024-07-05 NOTE — Discharge Instructions (Addendum)
 You received a steroid injection today  I recommend washing your sheets and clothing when you get home today  Be on the lookout for bed bugs, fleas, or other insects in your room  We will let you know if any results from today come back positive  Seek medical attention if your symptoms get worse or if you have any fevers, bleeding, vomiting, chest pain or trouble breathing
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
## 2024-07-05 NOTE — Telephone Encounter (Signed)
 Pt has changed PCP. Joseph Bautista

## 2024-07-06 LAB — CYTOLOGY, (ORAL, ANAL, URETHRAL) ANCILLARY ONLY
Chlamydia: NEGATIVE
Comment: NEGATIVE
Comment: NEGATIVE
Comment: NORMAL
Neisseria Gonorrhea: NEGATIVE
Trichomonas: NEGATIVE

## 2024-07-06 LAB — RPR: RPR Ser Ql: NONREACTIVE

## 2024-07-09 ENCOUNTER — Emergency Department (HOSPITAL_BASED_OUTPATIENT_CLINIC_OR_DEPARTMENT_OTHER)
Admission: EM | Admit: 2024-07-09 | Discharge: 2024-07-10 | Disposition: A | Discharge status: Home / Self Care | Payer: MEDICAID | Attending: Emergency Medicine | Admitting: Emergency Medicine

## 2024-07-09 ENCOUNTER — Other Ambulatory Visit: Payer: Self-pay

## 2024-07-09 ENCOUNTER — Emergency Department (HOSPITAL_BASED_OUTPATIENT_CLINIC_OR_DEPARTMENT_OTHER): Payer: MEDICAID

## 2024-07-09 DIAGNOSIS — R112 Nausea with vomiting, unspecified: Secondary | ICD-10-CM | POA: Insufficient documentation

## 2024-07-09 DIAGNOSIS — R519 Headache, unspecified: Secondary | ICD-10-CM | POA: Diagnosis not present

## 2024-07-09 DIAGNOSIS — I10 Essential (primary) hypertension: Secondary | ICD-10-CM | POA: Diagnosis not present

## 2024-07-09 DIAGNOSIS — Z7982 Long term (current) use of aspirin: Secondary | ICD-10-CM | POA: Insufficient documentation

## 2024-07-09 DIAGNOSIS — F1910 Other psychoactive substance abuse, uncomplicated: Secondary | ICD-10-CM | POA: Insufficient documentation

## 2024-07-09 DIAGNOSIS — R000 Tachycardia, unspecified: Secondary | ICD-10-CM | POA: Insufficient documentation

## 2024-07-09 DIAGNOSIS — Z79899 Other long term (current) drug therapy: Secondary | ICD-10-CM | POA: Insufficient documentation

## 2024-07-09 DIAGNOSIS — R079 Chest pain, unspecified: Secondary | ICD-10-CM | POA: Diagnosis present

## 2024-07-09 LAB — URINE DRUG SCREEN
Amphetamines: NOT DETECTED
Barbiturates: NOT DETECTED
Benzodiazepines: NOT DETECTED
Cocaine: DETECTED — AB
Fentanyl: NOT DETECTED
Methadone Scn, Ur: NOT DETECTED
Opiates: NOT DETECTED
Tetrahydrocannabinol: DETECTED — AB

## 2024-07-09 LAB — CBC
HCT: 46.3 % (ref 39.0–52.0)
Hemoglobin: 16.4 g/dL (ref 13.0–17.0)
MCH: 30.9 pg (ref 26.0–34.0)
MCHC: 35.4 g/dL (ref 30.0–36.0)
MCV: 87.2 fL (ref 80.0–100.0)
Platelets: 252 K/uL (ref 150–400)
RBC: 5.31 MIL/uL (ref 4.22–5.81)
RDW: 13.1 % (ref 11.5–15.5)
WBC: 13.1 K/uL — ABNORMAL HIGH (ref 4.0–10.5)
nRBC: 0 % (ref 0.0–0.2)

## 2024-07-09 NOTE — ED Notes (Signed)
 Pt got out of wheelchair and layed down on floor. Pt gotten back into wheelchair with help of staff and was taken to room where he was assisted into stretcher.
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
## 2024-07-09 NOTE — ED Triage Notes (Signed)
 Pt POV reporting possibly smoking laced marijuana. Pt experiencing tremors, n/v and chest pain.
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
## 2024-07-10 LAB — BASIC METABOLIC PANEL WITH GFR
Anion gap: 16 — ABNORMAL HIGH (ref 5–15)
BUN: 13 mg/dL (ref 6–20)
CO2: 25 mmol/L (ref 22–32)
Calcium: 9.7 mg/dL (ref 8.9–10.3)
Chloride: 96 mmol/L — ABNORMAL LOW (ref 98–111)
Creatinine, Ser: 0.93 mg/dL (ref 0.61–1.24)
GFR, Estimated: >60 mL/min (ref >60)
Glucose, Bld: 106 mg/dL — ABNORMAL HIGH (ref 70–99)
Potassium: 3.3 mmol/L — ABNORMAL LOW (ref 3.5–5.1)
Sodium: 138 mmol/L (ref 135–145)

## 2024-07-10 LAB — TROPONIN T, HIGH SENSITIVITY
Troponin T High Sensitivity: <15 ng/L (ref <19)
Troponin T High Sensitivity: <15 ng/L (ref <19)

## 2024-07-10 NOTE — ED Notes (Signed)
 Pt resting comfortably, calm, and cooperative. Restlessness and anxiety resolved.

## 2024-07-10 NOTE — Discharge Instructions (Addendum)
 Thank for letting us  evaluate you today.  Your workup was negative for heart injury.  Your electrolytes were within normal limits.  Your chest x-ray did not show any signs of fluid nor pneumonia.  I would recommend not participating in marijuana, drug use  Return to Emergency Department if you experience significant worsening symptoms, chest pain, shortness of breath

## 2024-08-12 ENCOUNTER — Other Ambulatory Visit (HOSPITAL_BASED_OUTPATIENT_CLINIC_OR_DEPARTMENT_OTHER): Payer: Self-pay

## 2024-08-12 MED ORDER — LAMOTRIGINE 25 MG PO TABS
ORAL_TABLET | ORAL | 0 refills | Status: AC
  Filled 2024-08-12: qty 42, 28d supply, fill #0

## 2024-08-12 MED ORDER — SERTRALINE HCL 50 MG PO TABS
ORAL_TABLET | ORAL | 0 refills | Status: AC
  Filled 2024-08-12: qty 30, 31d supply, fill #0

## 2024-08-22 ENCOUNTER — Other Ambulatory Visit (HOSPITAL_BASED_OUTPATIENT_CLINIC_OR_DEPARTMENT_OTHER): Payer: Self-pay

## 2024-08-31 ENCOUNTER — Other Ambulatory Visit (HOSPITAL_BASED_OUTPATIENT_CLINIC_OR_DEPARTMENT_OTHER): Payer: Self-pay

## 2024-10-05 ENCOUNTER — Encounter (HOSPITAL_BASED_OUTPATIENT_CLINIC_OR_DEPARTMENT_OTHER): Payer: MEDICAID | Admitting: Family Medicine

## 2024-10-17 ENCOUNTER — Encounter: Payer: Self-pay | Admitting: Radiology

## 2024-10-24 ENCOUNTER — Ambulatory Visit (INDEPENDENT_AMBULATORY_CARE_PROVIDER_SITE_OTHER): Payer: MEDICAID | Admitting: Family Medicine

## 2024-10-24 ENCOUNTER — Encounter (HOSPITAL_BASED_OUTPATIENT_CLINIC_OR_DEPARTMENT_OTHER): Payer: Self-pay | Admitting: Family Medicine

## 2024-10-24 ENCOUNTER — Other Ambulatory Visit (HOSPITAL_BASED_OUTPATIENT_CLINIC_OR_DEPARTMENT_OTHER): Payer: Self-pay

## 2024-10-24 VITALS — BP 131/72 | HR 80 | Ht 72.0 in | Wt 266.0 lb

## 2024-10-24 DIAGNOSIS — I10 Essential (primary) hypertension: Secondary | ICD-10-CM

## 2024-10-24 DIAGNOSIS — Z0000 Encounter for general adult medical examination without abnormal findings: Secondary | ICD-10-CM | POA: Diagnosis not present

## 2024-10-24 MED ORDER — ALBUTEROL SULFATE HFA 108 (90 BASE) MCG/ACT IN AERS
2.0000 | INHALATION_SPRAY | Freq: Four times a day (QID) | RESPIRATORY_TRACT | 2 refills | Status: AC | PRN
  Filled 2024-10-24: qty 18, 30d supply, fill #0
  Filled 2024-11-24: qty 6.7, 25d supply, fill #1

## 2024-10-24 MED ORDER — AMLODIPINE BESYLATE 10 MG PO TABS
10.0000 mg | ORAL_TABLET | Freq: Every day | ORAL | 1 refills | Status: AC
  Filled 2024-10-24 – 2024-11-24 (×3): qty 90, 90d supply, fill #0

## 2024-10-24 NOTE — Assessment & Plan Note (Signed)
 Routine HCM labs ordered. HCM reviewed/discussed. Anticipatory guidance regarding healthy weight, lifestyle and choices given. Recommend healthy diet.  Recommend approximately 150 minutes/week of moderate intensity exercise Recommend regular dental and vision exams Always use seatbelt/lap and shoulder restraints Recommend using smoke alarms and checking batteries at least twice a year Recommend using sunscreen when outside Discussed immunization recommendations
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
## 2024-10-25 ENCOUNTER — Ambulatory Visit (HOSPITAL_BASED_OUTPATIENT_CLINIC_OR_DEPARTMENT_OTHER): Payer: Self-pay | Admitting: Family Medicine

## 2024-10-25 DIAGNOSIS — E0590 Thyrotoxicosis, unspecified without thyrotoxic crisis or storm: Secondary | ICD-10-CM

## 2024-10-25 LAB — COMPREHENSIVE METABOLIC PANEL WITH GFR
ALT: 30 IU/L (ref 0–44)
AST: 18 IU/L (ref 0–40)
Albumin: 4.6 g/dL (ref 4.1–5.1)
Alkaline Phosphatase: 99 IU/L (ref 47–123)
BUN/Creatinine Ratio: 13 (ref 9–20)
BUN: 13 mg/dL (ref 6–24)
Bilirubin Total: 0.5 mg/dL (ref 0.0–1.2)
CO2: 23 mmol/L (ref 20–29)
Calcium: 9.6 mg/dL (ref 8.7–10.2)
Chloride: 98 mmol/L (ref 96–106)
Creatinine, Ser: 0.98 mg/dL (ref 0.76–1.27)
Globulin, Total: 2.9 g/dL (ref 1.5–4.5)
Glucose: 106 mg/dL — ABNORMAL HIGH (ref 70–99)
Potassium: 4.2 mmol/L (ref 3.5–5.2)
Sodium: 136 mmol/L (ref 134–144)
Total Protein: 7.5 g/dL (ref 6.0–8.5)
eGFR: 100 mL/min/1.73 (ref >59)

## 2024-10-25 LAB — CBC WITH DIFFERENTIAL/PLATELET
Basophils Absolute: 0 x10E3/uL (ref 0.0–0.2)
Basos: 1 %
EOS (ABSOLUTE): 0.1 x10E3/uL (ref 0.0–0.4)
Eos: 2 %
Hematocrit: 48.7 % (ref 37.5–51.0)
Hemoglobin: 16.2 g/dL (ref 13.0–17.7)
Immature Grans (Abs): 0 x10E3/uL (ref 0.0–0.1)
Immature Granulocytes: 0 %
Lymphocytes Absolute: 1.9 x10E3/uL (ref 0.7–3.1)
Lymphs: 34 %
MCH: 31 pg (ref 26.6–33.0)
MCHC: 33.3 g/dL (ref 31.5–35.7)
MCV: 93 fL (ref 79–97)
Monocytes Absolute: 0.3 x10E3/uL (ref 0.1–0.9)
Monocytes: 5 %
Neutrophils Absolute: 3.3 x10E3/uL (ref 1.4–7.0)
Neutrophils: 58 %
Platelets: 230 x10E3/uL (ref 150–450)
RBC: 5.23 x10E6/uL (ref 4.14–5.80)
RDW: 13.1 % (ref 11.6–15.4)
WBC: 5.7 x10E3/uL (ref 3.4–10.8)

## 2024-10-25 LAB — HEMOGLOBIN A1C
Est. average glucose Bld gHb Est-mCnc: 117 mg/dL
Hgb A1c MFr Bld: 5.7 % — ABNORMAL HIGH (ref 4.8–5.6)

## 2024-10-25 LAB — LIPID PANEL
Chol/HDL Ratio: 5 ratio (ref 0.0–5.0)
Cholesterol, Total: 208 mg/dL — ABNORMAL HIGH (ref 100–199)
HDL: 42 mg/dL (ref >39)
LDL Chol Calc (NIH): 130 mg/dL — ABNORMAL HIGH (ref 0–99)
Triglycerides: 204 mg/dL — ABNORMAL HIGH (ref 0–149)
VLDL Cholesterol Cal: 36 mg/dL (ref 5–40)

## 2024-10-25 LAB — TSH RFX ON ABNORMAL TO FREE T4: TSH: 0.382 u[IU]/mL — ABNORMAL LOW (ref 0.450–4.500)

## 2024-10-25 LAB — T4F: T4,Free (Direct): 1.08 ng/dL (ref 0.82–1.77)

## 2024-11-24 ENCOUNTER — Other Ambulatory Visit (HOSPITAL_BASED_OUTPATIENT_CLINIC_OR_DEPARTMENT_OTHER): Payer: Self-pay

## 2024-11-24 ENCOUNTER — Other Ambulatory Visit: Payer: Self-pay

## 2024-11-28 ENCOUNTER — Ambulatory Visit: Payer: Self-pay

## 2024-11-28 ENCOUNTER — Other Ambulatory Visit (HOSPITAL_BASED_OUTPATIENT_CLINIC_OR_DEPARTMENT_OTHER): Payer: Self-pay

## 2024-11-28 ENCOUNTER — Telehealth: Payer: MEDICAID | Admitting: Family Medicine

## 2024-11-28 DIAGNOSIS — J22 Unspecified acute lower respiratory infection: Secondary | ICD-10-CM

## 2024-11-28 DIAGNOSIS — R051 Acute cough: Secondary | ICD-10-CM

## 2024-11-28 MED ORDER — DOXYCYCLINE HYCLATE 100 MG PO TABS
100.0000 mg | ORAL_TABLET | Freq: Two times a day (BID) | ORAL | 0 refills | Status: AC
  Filled 2024-11-28: qty 14, 7d supply, fill #0

## 2024-11-28 MED ORDER — GUAIFENESIN-CODEINE 100-10 MG/5ML PO SOLN
5.0000 mL | Freq: Every evening | ORAL | 0 refills | Status: AC | PRN
  Filled 2024-11-28: qty 120, 12d supply, fill #0
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
## 2024-11-28 NOTE — Telephone Encounter (Signed)
 FYI Only or Action Required?: FYI only for provider: appointment scheduled on 12/15.  Patient was last seen in primary care on 10/24/2024 by de Cuba, Quintin PARAS, MD.  Called Nurse Triage reporting Cough.  Symptoms began x 2 days.  Interventions attempted: OTC medications: NyQuil, DayQuil, Mucinex .  Symptoms are: unchanged.  Triage Disposition: See Physician Within 24 Hours  Patient/caregiver understands and will follow disposition?: Yes             Copied from CRM #[id number]. Topic: Clinical - Red Word Triage >> Nov 28, 2024  2:08 PM Charlet HERO wrote: Red Word that prompted transfer to Nurse Triage: Paitent is calling he is fatigued, has cough, congestion with mucus yellowish green and he is sweating. Dr. Penne Reason for Disposition  [1] Continuous (nonstop) coughing interferes with work or school AND [2] no improvement using cough treatment per Care Advice  Answer Assessment - Initial Assessment Questions 1. ONSET: When did the cough begin?      X 3 days   2. SEVERITY: How bad is the cough today?      Deep congested cough   3. SPUTUM: Describe the color of your sputum (e.g., none, dry cough; clear, white, yellow, green)     Yellow -green  4. HEMOPTYSIS: Are you coughing up any blood? If Yes, ask: How much? (e.g., flecks, streaks, tablespoons, etc.)     Yes, streaks   5. DIFFICULTY BREATHING: Are you having difficulty breathing? If Yes, ask: How bad is it? (e.g., mild, moderate, severe)      No   6. FEVER: Do you have a fever? If Yes, ask: What is your temperature, how was it measured, and when did it start?     No   7. CARDIAC HISTORY: Do you have any history of heart disease? (e.g., heart attack, congestive heart failure)      No   8. LUNG HISTORY: Do you have any history of lung disease?  (e.g., pulmonary embolus, asthma, emphysema)  Asthma , albuterol  usage intermittent   10. OTHER SYMPTOMS: Do you have any other symptoms? (e.g.,  runny nose, wheezing, chest pain)   Wheezing during sleep earlier today, not currently     12. TRAVEL: Have you traveled out of the country in the last month? (e.g., travel history, exposures)  No   Patient called in to triage with complaints of productive cough. This has been ongoing for 3 days The patient stated the cough is productive with  congestion, Yellow-green mucus noted. Blood tinged.  For home care, the patient is taking OTC medications: NyQuil, DayQuil, Mucinex  .  Virtual Appointment scheduled for further evaluation for 12/15; Patient agrees with the plan of care, and will reach out if symptoms worsen or persist.  Protocols used: Cough - Acute Productive-A-AH

## 2025-01-12 ENCOUNTER — Other Ambulatory Visit (HOSPITAL_BASED_OUTPATIENT_CLINIC_OR_DEPARTMENT_OTHER): Payer: Self-pay

## 2025-01-12 MED ORDER — SERTRALINE HCL 50 MG PO TABS
50.0000 mg | ORAL_TABLET | Freq: Every day | ORAL | 2 refills | Status: AC
  Filled 2025-01-12: qty 30, 30d supply, fill #0

## 2025-01-12 MED ORDER — LAMOTRIGINE 25 MG PO TABS
50.0000 mg | ORAL_TABLET | Freq: Every day | ORAL | 2 refills | Status: AC
  Filled 2025-01-12: qty 60, 30d supply, fill #0

## 2025-01-25 ENCOUNTER — Ambulatory Visit (HOSPITAL_BASED_OUTPATIENT_CLINIC_OR_DEPARTMENT_OTHER): Payer: MEDICAID | Admitting: Family Medicine

## 2025-02-21 ENCOUNTER — Ambulatory Visit (HOSPITAL_BASED_OUTPATIENT_CLINIC_OR_DEPARTMENT_OTHER): Payer: MEDICAID | Admitting: Family Medicine
